# Patient Record
Sex: Female | Born: 1985 | Race: Black or African American | Hispanic: No | Smoking: Never smoker
Health system: Southern US, Community
[De-identification: ages and names within clinical notes are randomized; demographics above are authoritative.]

## PROBLEM LIST (undated history)

## (undated) DIAGNOSIS — J45909 Unspecified asthma, uncomplicated: Secondary | ICD-10-CM

## (undated) DIAGNOSIS — M419 Scoliosis, unspecified: Secondary | ICD-10-CM

## (undated) DIAGNOSIS — B019 Varicella without complication: Secondary | ICD-10-CM

## (undated) DIAGNOSIS — J302 Other seasonal allergic rhinitis: Secondary | ICD-10-CM

## (undated) DIAGNOSIS — Z9109 Other allergy status, other than to drugs and biological substances: Secondary | ICD-10-CM

## (undated) HISTORY — PX: TUBAL LIGATION: SHX77

## (undated) HISTORY — DX: Varicella without complication: B01.9

## (undated) HISTORY — PX: WISDOM TOOTH EXTRACTION: SHX21

## (undated) HISTORY — DX: Other allergy status, other than to drugs and biological substances: Z91.09

## (undated) HISTORY — DX: Other seasonal allergic rhinitis: J30.2

---

## 2013-02-15 ENCOUNTER — Emergency Department (HOSPITAL_COMMUNITY)
Admission: EM | Admit: 2013-02-15 | Discharge: 2013-02-16 | Disposition: A | Discharge status: Home / Self Care | Payer: Managed Care, Other (non HMO) | Attending: Emergency Medicine | Admitting: Emergency Medicine

## 2013-02-15 ENCOUNTER — Encounter (HOSPITAL_COMMUNITY): Payer: Self-pay | Admitting: Emergency Medicine

## 2013-02-15 DIAGNOSIS — B349 Viral infection, unspecified: Secondary | ICD-10-CM

## 2013-02-15 DIAGNOSIS — J45909 Unspecified asthma, uncomplicated: Secondary | ICD-10-CM | POA: Insufficient documentation

## 2013-02-15 DIAGNOSIS — B9789 Other viral agents as the cause of diseases classified elsewhere: Secondary | ICD-10-CM | POA: Insufficient documentation

## 2013-02-15 DIAGNOSIS — J3489 Other specified disorders of nose and nasal sinuses: Secondary | ICD-10-CM | POA: Insufficient documentation

## 2013-02-15 DIAGNOSIS — Z8739 Personal history of other diseases of the musculoskeletal system and connective tissue: Secondary | ICD-10-CM | POA: Insufficient documentation

## 2013-02-15 DIAGNOSIS — Z791 Long term (current) use of non-steroidal anti-inflammatories (NSAID): Secondary | ICD-10-CM | POA: Insufficient documentation

## 2013-02-15 DIAGNOSIS — Z3202 Encounter for pregnancy test, result negative: Secondary | ICD-10-CM | POA: Insufficient documentation

## 2013-02-15 HISTORY — DX: Scoliosis, unspecified: M41.9

## 2013-02-15 HISTORY — DX: Unspecified asthma, uncomplicated: J45.909

## 2013-02-15 LAB — POCT PREGNANCY, URINE: Preg Test, Ur: NEGATIVE

## 2013-02-15 NOTE — ED Notes (Signed)
Pt c/o generalized body aches, chills, and malaise x2 weeks. Reports cough and DOE since this evening while at work. Pt states cough is dry/non-productive and reports aches/chills have gotten worse over the last two days. Pt A&Ox4, in NAD at this time.

## 2013-02-15 NOTE — ED Notes (Signed)
Pt. reports generalized body aches with chills , dry cough  And runny nose for 2 weeks unrelieved by OTC medications .

## 2013-02-16 MED ORDER — ALBUTEROL SULFATE HFA 108 (90 BASE) MCG/ACT IN AERS: 2.0000 | INHALATION_SPRAY | RESPIRATORY_TRACT | Status: AC | PRN

## 2013-02-16 MED ORDER — NAPROXEN 375 MG PO TABS: 375.0000 mg | ORAL_TABLET | Freq: Two times a day (BID) | ORAL | Status: DC

## 2013-02-16 NOTE — ED Provider Notes (Signed)
CSN: [id number]     Arrival date & time 02/15/13  1851 History   First MD Initiated Contact with Patient 02/15/13 2305     Chief Complaint  Patient presents with  . Generalized Body Aches   (Consider location/radiation/quality/duration/timing/severity/associated sxs/prior Treatment) Patient is a 27 y.o. female presenting with URI. The history is provided by the patient.  URI Presenting symptoms: congestion, cough and rhinorrhea   Presenting symptoms: no fever   Congestion:    Location:  Nasal   Interferes with sleep: no     Interferes with eating/drinking: no   Cough:    Cough characteristics:  Non-productive   Severity:  Mild   Onset quality:  Gradual   Timing:  Sporadic   Progression:  Unchanged   Chronicity:  New Severity:  Moderate Onset quality:  Gradual Timing:  Constant Progression:  Unchanged Chronicity:  New Relieved by:  Nothing Worsened by:  Nothing tried Ineffective treatments:  None tried Associated symptoms: no neck pain, no swollen glands and no wheezing   Risk factors: sick contacts   Risk factors: no recent illness     Past Medical History  Diagnosis Date  . Asthma   . Scoliosis    Past Surgical History  Procedure Laterality Date  . Tubal ligation     No family history on file. History  Substance Use Topics  . Smoking status: Never Smoker   . Smokeless tobacco: Not on file  . Alcohol Use: No   OB History   Grav Para Term Preterm Abortions TAB SAB Ect Mult Living                 Review of Systems  Constitutional: Negative for fever.  HENT: Positive for congestion and rhinorrhea. Negative for drooling.   Respiratory: Positive for cough. Negative for wheezing.   Cardiovascular: Negative for chest pain and leg swelling.  Musculoskeletal: Negative for neck pain.  All other systems reviewed and are negative.    Allergies  Shellfish allergy  Home Medications   Current Outpatient Rx  Name  Route  Sig  Dispense  Refill  . naproxen  (NAPROSYN) 375 MG tablet   Oral   Take 1 tablet (375 mg total) by mouth 2 (two) times daily.   20 tablet   0    BP 115/65  Pulse 99  Temp(Src) 99.7 F (37.6 C) (Oral)  Resp 20  SpO2 99% Physical Exam  Constitutional: She is oriented to person, place, and time. She appears well-developed and well-nourished. No distress.  HENT:  Head: Normocephalic and atraumatic.  Mouth/Throat: Oropharynx is clear and moist. No oropharyngeal exudate.  Eyes: Conjunctivae are normal. Pupils are equal, round, and reactive to light.  Neck: Normal range of motion. Neck supple.  Cardiovascular: Normal rate, regular rhythm and intact distal pulses.   Pulmonary/Chest: Effort normal and breath sounds normal. She has no wheezes. She has no rales.  Abdominal: Soft. Bowel sounds are normal. There is no tenderness. There is no rebound and no guarding.  Musculoskeletal: Normal range of motion.  Lymphadenopathy:    She has no cervical adenopathy.  Neurological: She is alert and oriented to person, place, and time.  Skin: Skin is warm and dry.  Psychiatric: She has a normal mood and affect.    ED Course  Procedures (including critical care time) Labs Review Labs Reviewed  RAPID STREP SCREEN  CULTURE, GROUP A STREP  POCT PREGNANCY, URINE   Imaging Review No results found.  EKG Interpretation   None  MDM   1. Viral syndrome    Will treat pain with NSAIDS and recommended mucinex.  Will refill inhaler    Billey Wojciak K Amahd Morino-Rasch, MD 02/16/13 0145

## 2013-02-17 LAB — CULTURE, GROUP A STREP

## 2013-07-10 ENCOUNTER — Telehealth: Payer: Self-pay | Admitting: Physician Assistant

## 2013-07-10 ENCOUNTER — Ambulatory Visit (INDEPENDENT_AMBULATORY_CARE_PROVIDER_SITE_OTHER): Payer: Managed Care, Other (non HMO) | Admitting: Physician Assistant

## 2013-07-10 ENCOUNTER — Encounter: Payer: Self-pay | Admitting: Physician Assistant

## 2013-07-10 ENCOUNTER — Ambulatory Visit (HOSPITAL_BASED_OUTPATIENT_CLINIC_OR_DEPARTMENT_OTHER)
Admission: RE | Admit: 2013-07-10 | Discharge: 2013-07-10 | Disposition: A | Discharge status: Home / Self Care | Payer: Managed Care, Other (non HMO) | Source: Ambulatory Visit | Attending: Physician Assistant | Admitting: Physician Assistant

## 2013-07-10 VITALS — BP 112/76 | HR 94 | Temp 98.7°F | Resp 16 | Ht 62.0 in | Wt 189.2 lb

## 2013-07-10 DIAGNOSIS — R053 Chronic cough: Secondary | ICD-10-CM

## 2013-07-10 DIAGNOSIS — R05 Cough: Secondary | ICD-10-CM

## 2013-07-10 DIAGNOSIS — J189 Pneumonia, unspecified organism: Secondary | ICD-10-CM | POA: Insufficient documentation

## 2013-07-10 DIAGNOSIS — R918 Other nonspecific abnormal finding of lung field: Secondary | ICD-10-CM | POA: Insufficient documentation

## 2013-07-10 DIAGNOSIS — R059 Cough, unspecified: Secondary | ICD-10-CM | POA: Insufficient documentation

## 2013-07-10 DIAGNOSIS — I517 Cardiomegaly: Secondary | ICD-10-CM | POA: Insufficient documentation

## 2013-07-10 LAB — CBC WITH DIFFERENTIAL/PLATELET
BASOS PCT: 0.4 % (ref 0.0–3.0)
Basophils Absolute: 0 10*3/uL (ref 0.0–0.1)
Eosinophils Absolute: 0.3 10*3/uL (ref 0.0–0.7)
Eosinophils Relative: 3.5 % (ref 0.0–5.0)
HCT: 37.1 % (ref 36.0–46.0)
HEMOGLOBIN: 12.1 g/dL (ref 12.0–15.0)
Lymphocytes Relative: 25.5 % (ref 12.0–46.0)
Lymphs Abs: 2 10*3/uL (ref 0.7–4.0)
MCHC: 32.7 g/dL (ref 30.0–36.0)
MCV: 88.4 fl (ref 78.0–100.0)
Monocytes Absolute: 0.7 10*3/uL (ref 0.1–1.0)
Monocytes Relative: 8.3 % (ref 3.0–12.0)
NEUTROS ABS: 4.9 10*3/uL (ref 1.4–7.7)
Neutrophils Relative %: 62.3 % (ref 43.0–77.0)
Platelets: 248 10*3/uL (ref 150.0–400.0)
RBC: 4.2 Mil/uL (ref 3.87–5.11)
RDW: 14.1 % (ref 11.5–14.6)
WBC: 7.9 10*3/uL (ref 4.5–10.5)

## 2013-07-10 MED ORDER — OMEPRAZOLE 20 MG PO CPDR: 20.0000 mg | DELAYED_RELEASE_CAPSULE | Freq: Every day | ORAL | Status: DC

## 2013-07-10 MED ORDER — LEVOFLOXACIN 750 MG PO TABS: 750.0000 mg | ORAL_TABLET | Freq: Every day | ORAL | Status: DC

## 2013-07-10 MED ORDER — HYDROCOD POLST-CHLORPHEN POLST 10-8 MG/5ML PO LQCR: 5.0000 mL | Freq: Two times a day (BID) | ORAL | Status: DC | PRN

## 2013-07-10 NOTE — Telephone Encounter (Signed)
Spoke with patient. CXR reveals Community-Acquired Pneumonia.  Rx Levaquin.  Continue care as discussed at today's visit.  Follow-up in 1 week.

## 2013-07-10 NOTE — Patient Instructions (Signed)
Please obtain labs.  I will call you with your results. Please go to [facility] [place] at [id number] [place], [place] for x-ray.  The imaging department is located on the first floor just right of the elevator.  I will call you with these results as well.  Start prilosec daily.  Use Tussionex as directed for cough.  We will alter therapy based on results.  Follow-up with Allergist as scheduled.  Follow-up here in 2 weeks.  Return sooner if symptoms worsen.

## 2013-07-10 NOTE — Assessment & Plan Note (Addendum)
Chronic cough x 5 months.  Multiple allergy medications tried.  Patient is a non-smoker.  Question GERD vs Pulmonary etiology.  Will obtain labs and CXR to r/o pathology.  Will start on 2-week trial of Prilosec.  Increase fluid intake.  Use Tussionex for cough.    Update -- CXR reveals pneumonia.  Rx Levaquin.  Supportive measures reiterated.  Follow-up in 1 week.

## 2013-07-10 NOTE — Progress Notes (Signed)
Pre visit review using our clinic review tool, if applicable. No additional management support is needed unless otherwise documented below in the visit note/SLS  

## 2013-07-10 NOTE — Progress Notes (Signed)
Patient presents to clinic today c/o chronic dry cough x 5 months.  Patient states she has [facility] onset of symptoms.  Endorses continued cough.  Stated cough is persistent and sometimes causes her to dry heave.  Endorses a couple of emetic episodes.  Denies heartburn or globus.  Denies hx of asthma.  Has had some allergy symptoms including rhinorrhea and watery eyes.  Has tried zyrtec, Claritin, allegra, off-brand preparations, Nasonex and Flonase with no relief of symptoms.  Past Medical History  Diagnosis Date  . Asthma   . Scoliosis   . Chicken pox   . Seasonal allergies   . Environmental allergies     Current Outpatient Prescriptions on File Prior to Visit  Medication Sig Dispense Refill  . albuterol (PROVENTIL HFA;VENTOLIN HFA) 108 (90 BASE) MCG/ACT inhaler Inhale 2 puffs into the lungs every 4 (four) hours as needed for wheezing or shortness of breath.  1 Inhaler  0  . naproxen (NAPROSYN) 375 MG tablet Take 1 tablet (375 mg total) by mouth 2 (two) times daily.  20 tablet  0   No current facility-administered medications on file prior to visit.    Allergies  Allergen Reactions  . Shellfish Allergy Anaphylaxis    Family History  Problem Relation Age of Onset  . Hypertension Father     Living  . Hypertension Paternal Grandfather   . Diabetes Maternal Grandmother   . Diabetes Paternal Grandfather   . Healthy Mother     Living  . Alzheimer's disease Paternal Grandfather   . Hypertension Other     Paternal Aunts & Uncles  . Cancer Neg Hx   . Healthy Brother     x1  . Healthy Sister     x1  . Allergies Daughter   . Healthy Son     History   Social History  . Marital Status: Single    Spouse Name: N/A    Number of Children: N/A  . Years of Education: N/A   Social History Main Topics  . Smoking status: Never Smoker   . Smokeless tobacco: None  . Alcohol Use: No  . Drug Use: No  . Sexual Activity:  None   Other Topics Concern  . None   Social History Narrative  . None   Review of Systems - See HPI.  All other ROS are negative.  BP 112/76  Pulse 94  Temp(Src) 98.7 F (37.1 C) (Oral)  Resp 16  Ht 5\' 2"  (1.575 m)  Wt 189 lb 4 oz (85.843 kg)  BMI 34.61 kg/m2  SpO2 100%  LMP 07/09/2013  Physical Exam  Vitals reviewed. Constitutional: She is oriented to person, place, and time and well-developed, well-nourished, and in no distress.  HENT:  Head: Normocephalic and atraumatic.  Right Ear: External ear normal.  Left Ear: External ear normal.  Nose: Nose normal.  Mouth/Throat: Oropharynx is clear and moist. No oropharyngeal exudate.  TM within normal limits bilaterally.  No TTP of sinuses noted on exam.  Eyes: Conjunctivae are normal. Pupils are equal, round, and reactive to light.  Neck: Neck supple.  Cardiovascular: Normal rate, regular rhythm, normal heart sounds and intact distal pulses.   Pulmonary/Chest: Effort normal and breath sounds normal. No respiratory distress. She has no wheezes. She has no rales. She exhibits no tenderness.  Lymphadenopathy:    She has no cervical adenopathy.  Neurological: She is alert and oriented to person, place, and time.  Skin: Skin is warm and dry. No rash noted.  Psychiatric: Affect normal.    Recent Results (from the past 2160 hour(s))  CBC WITH DIFFERENTIAL     Status: None   Collection Time    07/10/13 10:47 AM      Result Value Ref Range   WBC 7.9  4.5 - 10.5 K/uL   RBC 4.20  3.87 - 5.11 Mil/uL   Hemoglobin 12.1  12.0 - 15.0 g/dL   HCT 16.137.1  09.636.0 - 04.546.0 %   MCV 88.4  78.0 - 100.0 fl   MCHC 32.7  30.0 - 36.0 g/dL   RDW 40.914.1  81.111.5 - 91.414.6 %   Platelets 248.0  150.0 - 400.0 K/uL   Neutrophils Relative % 62.3  43.0 - 77.0 %   Lymphocytes Relative 25.5  12.0 - 46.0 %   Monocytes Relative 8.3  3.0 - 12.0 %   Eosinophils Relative 3.5  0.0 - 5.0 %   Basophils Relative 0.4  0.0 - 3.0 %   Neutro Abs 4.9  1.4 - 7.7 K/uL   Lymphs  Abs 2.0  0.7 - 4.0 K/uL   Monocytes Absolute 0.7  0.1 - 1.0 K/uL   Eosinophils Absolute 0.3  0.0 - 0.7 K/uL   Basophils Absolute 0.0  0.0 - 0.1 K/uL   Assessment/Plan: CAP (community acquired pneumonia) Chronic cough x 5 months.  Multiple allergy medications tried.  Patient is a non-smoker.  Question GERD vs Pulmonary etiology.  Will obtain labs and CXR to r/o pathology.  Will start on 2-week trial of Prilosec.  Increase fluid intake.  Use Tussionex for cough.    Update -- CXR reveals pneumonia.  Rx Levaquin.  Supportive measures reiterated.  Follow-up in 1 week.

## 2013-07-11 NOTE — Addendum Note (Signed)
Addended by: Marcelline MatesMARTIN, Caterina Racine on: 07/11/2013 09:56 AM   Modules accepted: Level of Service

## 2013-07-12 ENCOUNTER — Ambulatory Visit: Payer: Self-pay | Admitting: Physician Assistant

## 2013-07-12 ENCOUNTER — Telehealth: Payer: Self-pay | Admitting: *Deleted

## 2013-07-12 DIAGNOSIS — J189 Pneumonia, unspecified organism: Secondary | ICD-10-CM

## 2013-07-12 MED ORDER — BENZONATATE 100 MG PO CAPS: 100.0000 mg | ORAL_CAPSULE | Freq: Two times a day (BID) | ORAL | Status: DC | PRN

## 2013-07-12 NOTE — Telephone Encounter (Signed)
Sent in tessalon perles.  Take as directed.  I hope this helps with cough.

## 2013-07-12 NOTE — Telephone Encounter (Signed)
Patient reports that cough medication prescribed on 05.04.15 is only making her sleep but does not help with cough; request alternative Rx to [facility] pharmacy/SLS

## 2013-07-13 NOTE — Telephone Encounter (Signed)
Patient informed, understood & agreed/SLS  

## 2013-07-17 ENCOUNTER — Encounter: Payer: Self-pay | Admitting: Physician Assistant

## 2013-07-17 ENCOUNTER — Telehealth: Payer: Self-pay | Admitting: Physician Assistant

## 2013-07-17 ENCOUNTER — Encounter: Payer: Self-pay | Admitting: *Deleted

## 2013-07-17 ENCOUNTER — Ambulatory Visit (HOSPITAL_BASED_OUTPATIENT_CLINIC_OR_DEPARTMENT_OTHER)
Admission: RE | Admit: 2013-07-17 | Discharge: 2013-07-17 | Disposition: A | Discharge status: Home / Self Care | Payer: Managed Care, Other (non HMO) | Source: Ambulatory Visit | Attending: Physician Assistant | Admitting: Physician Assistant

## 2013-07-17 ENCOUNTER — Ambulatory Visit (INDEPENDENT_AMBULATORY_CARE_PROVIDER_SITE_OTHER): Payer: Managed Care, Other (non HMO) | Admitting: Physician Assistant

## 2013-07-17 VITALS — BP 104/72 | HR 83 | Temp 99.0°F | Resp 16 | Ht 62.0 in | Wt 188.2 lb

## 2013-07-17 DIAGNOSIS — R05 Cough: Secondary | ICD-10-CM

## 2013-07-17 DIAGNOSIS — R053 Chronic cough: Secondary | ICD-10-CM | POA: Insufficient documentation

## 2013-07-17 DIAGNOSIS — J309 Allergic rhinitis, unspecified: Secondary | ICD-10-CM

## 2013-07-17 DIAGNOSIS — J189 Pneumonia, unspecified organism: Secondary | ICD-10-CM

## 2013-07-17 DIAGNOSIS — R059 Cough, unspecified: Secondary | ICD-10-CM

## 2013-07-17 DIAGNOSIS — Z0000 Encounter for general adult medical examination without abnormal findings: Secondary | ICD-10-CM | POA: Insufficient documentation

## 2013-07-17 DIAGNOSIS — M4120 Other idiopathic scoliosis, site unspecified: Secondary | ICD-10-CM | POA: Insufficient documentation

## 2013-07-17 LAB — LIPID PANEL
Cholesterol: 154 mg/dL (ref 0–200)
HDL: 44 mg/dL (ref >39.00)
LDL CALC: 97 mg/dL (ref 0–99)
TRIGLYCERIDES: 64 mg/dL (ref 0.0–149.0)
Total CHOL/HDL Ratio: 4
VLDL: 12.8 mg/dL (ref 0.0–40.0)

## 2013-07-17 LAB — CBC WITH DIFFERENTIAL/PLATELET
Basophils Absolute: 0 10*3/uL (ref 0.0–0.1)
Basophils Relative: 0.3 % (ref 0.0–3.0)
EOS PCT: 4 % (ref 0.0–5.0)
Eosinophils Absolute: 0.3 10*3/uL (ref 0.0–0.7)
HCT: 43.2 % (ref 36.0–46.0)
HEMOGLOBIN: 14 g/dL (ref 12.0–15.0)
Lymphocytes Relative: 23.7 % (ref 12.0–46.0)
Lymphs Abs: 1.7 10*3/uL (ref 0.7–4.0)
MCHC: 32.4 g/dL (ref 30.0–36.0)
MCV: 87.8 fl (ref 78.0–100.0)
MONOS PCT: 8.6 % (ref 3.0–12.0)
Monocytes Absolute: 0.6 10*3/uL (ref 0.1–1.0)
NEUTROS ABS: 4.5 10*3/uL (ref 1.4–7.7)
Neutrophils Relative %: 63.4 % (ref 43.0–77.0)
Platelets: 228 10*3/uL (ref 150.0–400.0)
RBC: 4.91 Mil/uL (ref 3.87–5.11)
RDW: 14 % (ref 11.5–15.5)
WBC: 7.1 10*3/uL (ref 4.0–10.5)

## 2013-07-17 LAB — HEPATIC FUNCTION PANEL
ALBUMIN: 3.9 g/dL (ref 3.5–5.2)
ALT: 11 U/L (ref 0–35)
AST: 23 U/L (ref 0–37)
Alkaline Phosphatase: 84 U/L (ref 39–117)
Bilirubin, Direct: 0 mg/dL (ref 0.0–0.3)
Total Bilirubin: 0.6 mg/dL (ref 0.2–1.2)
Total Protein: 8.1 g/dL (ref 6.0–8.3)

## 2013-07-17 LAB — TSH: TSH: 0.55 u[IU]/mL (ref 0.35–4.50)

## 2013-07-17 LAB — HEMOGLOBIN A1C: Hgb A1c MFr Bld: 5.2 % (ref 4.6–6.5)

## 2013-07-17 MED ORDER — MONTELUKAST SODIUM 10 MG PO TABS
10.0000 mg | ORAL_TABLET | Freq: Every day | ORAL | Status: AC
Start: 2013-07-17 — End: ?

## 2013-07-17 MED ORDER — AZITHROMYCIN 250 MG PO TABS: ORAL_TABLET | ORAL | Status: DC

## 2013-07-17 MED ORDER — ONDANSETRON 8 MG PO TBDP
8.0000 mg | ORAL_TABLET | Freq: Three times a day (TID) | ORAL | Status: AC | PRN
Start: 2013-07-17 — End: ?

## 2013-07-17 MED ORDER — PROMETHAZINE-DM 6.25-15 MG/5ML PO SYRP: 5.0000 mL | ORAL_SOLUTION | Freq: Four times a day (QID) | ORAL | Status: DC | PRN

## 2013-07-17 NOTE — Progress Notes (Signed)
Pre visit review using our clinic review tool, if applicable. No additional management support is needed unless otherwise documented below in the visit note/SLS  

## 2013-07-17 NOTE — Patient Instructions (Addendum)
Please take antibiotic as directed.  Use other medications as prescribed when needed.  Take singulair daily.  You can stop the omeprazole. Please have your cousin drive [facility]. You will be contacted by Pulmonology for an appointment.  Please keep your appointment with the Allergy specialist.  I will call you with your lab results.  If anything comes back abnormal, we will treat you accordingly.  Please call or return to clinic if symptoms are not improving and/or in 1 week if you have not seen Pulmonology yet.  IF symptoms acutely worsen, please call 911 or proceed to the ER.

## 2013-07-17 NOTE — Progress Notes (Signed)
Patient presents to clinic today to formally establish care.  Patient was seen last week for an acute concern and was diagnosed with Community-Acquired Pneumonia.    Acute Concerns: CAP -- diagnosed 1 week ago via X-ray.  Patient has completed course of Levaquin.  Patient endorses improvement in her energy level.  Still having cough, which had become dry, but states cough has now become productive again..  Denies pleuritic chest pain, SOB or wheezing.  Endorses continued rhinorrhea, PND and cough. Vital signs are within normal limits with Os sats at 100 % RA.  Chronic Issues: No known significant PMH.  Health Maintenance: Dental -- Overdue. Has upcoming appointment this month [facility] -- Overdue Immunizations -- Overdue for Tetanus.  Declines at today's visit. PAP -- Last PAP 07/2013.  Followed by OB/GYN.  Past Medical History  Diagnosis Date  . Asthma   . Scoliosis   . Chicken pox   . Seasonal allergies   . Environmental allergies     Past Surgical History  Procedure Laterality Date  . Tubal ligation    . Wisdom tooth extraction      Current Outpatient Prescriptions on File Prior to Visit  Medication Sig Dispense Refill  . albuterol (PROVENTIL HFA;VENTOLIN HFA) 108 (90 BASE) MCG/ACT inhaler Inhale 2 puffs into the lungs every 4 (four) hours as needed for wheezing or shortness of breath.  1 Inhaler  0  . omeprazole (PRILOSEC) 20 MG capsule Take 1 capsule (20 mg total) by mouth daily.  30 capsule  3  . naproxen (NAPROSYN) 375 MG tablet Take 1 tablet (375 mg total) by mouth 2 (two) times daily.  20 tablet  0   No current facility-administered medications on file prior to visit.    Allergies  Allergen Reactions  . Shellfish Allergy Anaphylaxis    Family History  Problem Relation Age of Onset  . Hypertension Father     Living  . Hypertension Paternal Grandfather   . Diabetes Maternal Grandmother   . Diabetes Paternal Grandfather   . Healthy Mother     Living  .  Alzheimer's disease Paternal Grandfather   . Hypertension Other     Paternal Aunts & Uncles  . Cancer Neg Hx   . Healthy Brother     x1  . Healthy Sister     x1  . Allergies Daughter   . Healthy Son     History   Social History  . Marital Status: Single    Spouse Name: N/A    Number of Children: N/A  . Years of Education: N/A   Occupational History  . Not on file.   Social History Main Topics  . Smoking status: Never Smoker   . Smokeless tobacco: Never Used  . Alcohol Use: Yes     Comment: occasionally  . Drug Use: No  . Sexual Activity: Not Currently    Birth Control/ Protection: Surgical   Other Topics Concern  . Not on file   Social History Narrative  . No narrative on file    Review of Systems  Constitutional: Negative for fever and weight loss.  HENT: Negative for ear discharge, ear pain, hearing loss and tinnitus.   Eyes: Negative for blurred vision, double vision and photophobia.  Respiratory: Positive for cough and sputum production. Negative for hemoptysis, shortness of breath and wheezing.   Gastrointestinal: Positive for nausea. Negative for heartburn, vomiting, abdominal pain, diarrhea, constipation, blood in stool and melena.  Genitourinary: Negative for dysuria, urgency, frequency, hematuria and  flank pain.  Neurological: Positive for headaches. Negative for dizziness and loss of consciousness.  Endo/Heme/Allergies: Positive for environmental allergies.  Psychiatric/Behavioral: Negative for depression, suicidal ideas, hallucinations and substance abuse. The patient is not nervous/anxious and does not have insomnia.    BP 104/72  Pulse 83  Temp(Src) 99 F (37.2 C) (Oral)  Resp 16  Ht 5\' 2"  (1.575 m)  Wt 188 lb 4 oz (85.39 kg)  BMI 34.42 kg/m2  SpO2 100%  LMP 07/09/2013  Physical Exam  Vitals reviewed. Constitutional: She is oriented to person, place, and time and well-developed, well-nourished, and in no distress.  HENT:  Head:  Normocephalic and atraumatic.  Right Ear: External ear normal.  Left Ear: External ear normal.  Nose: Nose normal.  Mouth/Throat: Oropharynx is clear and moist. No oropharyngeal exudate.  TM within normal limits bilaterally.  Eyes: Conjunctivae and EOM are normal. Pupils are equal, round, and reactive to light.  Neck: Normal range of motion. Neck supple.  Cardiovascular: Normal rate, regular rhythm, normal heart sounds and intact distal pulses.   Pulmonary/Chest: Effort normal and breath sounds normal. No respiratory distress. She has no wheezes. She has no rales. She exhibits no tenderness.  Abdominal: Soft. Bowel sounds are normal. She exhibits no distension and no mass. There is no tenderness. There is no rebound and no guarding.  Lymphadenopathy:    She has no cervical adenopathy.  Neurological: She is alert and oriented to person, place, and time.  Skin: Skin is warm and dry. No rash noted.  Psychiatric: Affect normal.    Recent Results (from the past 2160 hour(s))  CBC WITH DIFFERENTIAL     Status: None   Collection Time    07/10/13 10:47 AM      Result Value Ref Range   WBC 7.9  4.5 - 10.5 K/uL   RBC 4.20  3.87 - 5.11 Mil/uL   Hemoglobin 12.1  12.0 - 15.0 g/dL   HCT 60.437.1  54.036.0 - 98.146.0 %   MCV 88.4  78.0 - 100.0 fl   MCHC 32.7  30.0 - 36.0 g/dL   RDW 19.114.1  47.811.5 - 29.514.6 %   Platelets 248.0  150.0 - 400.0 K/uL   Neutrophils Relative % 62.3  43.0 - 77.0 %   Lymphocytes Relative 25.5  12.0 - 46.0 %   Monocytes Relative 8.3  3.0 - 12.0 %   Eosinophils Relative 3.5  0.0 - 5.0 %   Basophils Relative 0.4  0.0 - 3.0 %   Neutro Abs 4.9  1.4 - 7.7 K/uL   Lymphs Abs 2.0  0.7 - 4.0 K/uL   Monocytes Absolute 0.7  0.1 - 1.0 K/uL   Eosinophils Absolute 0.3  0.0 - 0.7 K/uL   Basophils Absolute 0.0  0.0 - 0.1 K/uL   Assessment/Plan: CAP (community acquired pneumonia) Vitals are good.  Exam unremarkable.  Patient still with chronic cough of unknown etiology.  Will Rx Azithromycin.  Rx  Singulair for allergies.  Continue Flonase.  Continue other supportive measures.  Will obtain labs at today's visit. Will obtain repeat CXR to ensure no worsening of pneumonia on film. Follow-up with allergist.  Referral placed to Pulmonology for further evaluation  Of note, patient became lightheaded and nauseas when lab technician was obtaining labs.  Patient was brought back to run to lie down.  All blood work was not able to be obtained.  Patient continued to feel lightheaded.  Repeat examination and vital signs were unremarkable.  Discussed increasing fluid  intake.  Patient stable before being allowed to leave clinic.  Patient's cousin drove patient home.  Patient and family member instructed to take patient to the ER if symptoms did not improve.  Visit for preventive health examination Medical history reviewed and updated. Will obtain fasting labs.   Patient followed by OB/GYN.  UTD on Pap.

## 2013-07-17 NOTE — Assessment & Plan Note (Addendum)
Vitals are good.  Exam unremarkable.  Patient still with chronic cough of unknown etiology.  Will Rx Azithromycin.  Rx Singulair for allergies.  Continue Flonase.  Continue other supportive measures.  Will obtain labs at today's visit. Will obtain repeat CXR to ensure no worsening of pneumonia on film. Follow-up with allergist.  Referral placed to Pulmonology for further evaluation  Of note, patient became lightheaded and nauseas when lab technician was obtaining labs.  Patient was brought back to run to lie down.  All blood work was not able to be obtained.  Patient continued to feel lightheaded.  Repeat examination and vital signs were unremarkable.  Discussed increasing fluid intake.  Patient stable before being allowed to leave clinic.  Patient's cousin drove patient home.  Patient and family member instructed to take patient to the ER if symptoms did not improve.

## 2013-07-17 NOTE — Assessment & Plan Note (Signed)
Medical history reviewed and updated. Will obtain fasting labs.   Patient followed by OB/GYN.  UTD on Pap.

## 2013-07-17 NOTE — Telephone Encounter (Signed)
Discussed results personally with patient.  Patient has appointment scheduled with Dr. Sherene SiresWert on Friday for further evaluation.  Patient to continue care as discussed at her visit today.

## 2013-07-18 LAB — HEPATIC FUNCTION PANEL
ALT: 8 U/L (ref 0–35)
AST: 14 U/L (ref 0–37)
Albumin: 3.9 g/dL (ref 3.5–5.2)
Alkaline Phosphatase: 83 U/L (ref 39–117)
BILIRUBIN INDIRECT: 0.2 mg/dL (ref 0.2–1.2)
Bilirubin, Direct: 0.1 mg/dL (ref 0.0–0.3)
TOTAL PROTEIN: 7.1 g/dL (ref 6.0–8.3)
Total Bilirubin: 0.3 mg/dL (ref 0.2–1.2)

## 2013-07-18 LAB — LIPID PANEL
Cholesterol: 142 mg/dL (ref 0–200)
HDL: 45 mg/dL (ref >39)
LDL CALC: 85 mg/dL (ref 0–99)
TRIGLYCERIDES: 61 mg/dL (ref <150)
Total CHOL/HDL Ratio: 3.2 Ratio
VLDL: 12 mg/dL (ref 0–40)

## 2013-07-18 LAB — TSH: TSH: 0.852 u[IU]/mL (ref 0.350–4.500)

## 2013-07-21 ENCOUNTER — Ambulatory Visit (INDEPENDENT_AMBULATORY_CARE_PROVIDER_SITE_OTHER): Payer: Managed Care, Other (non HMO) | Admitting: Internal Medicine

## 2013-07-21 ENCOUNTER — Encounter: Payer: Self-pay | Admitting: Internal Medicine

## 2013-07-21 ENCOUNTER — Encounter: Payer: Self-pay | Admitting: *Deleted

## 2013-07-21 VITALS — BP 102/64 | HR 88 | Temp 98.1°F | Ht 61.0 in | Wt 191.2 lb

## 2013-07-21 DIAGNOSIS — R059 Cough, unspecified: Secondary | ICD-10-CM

## 2013-07-21 DIAGNOSIS — R05 Cough: Secondary | ICD-10-CM

## 2013-07-21 DIAGNOSIS — R053 Chronic cough: Secondary | ICD-10-CM

## 2013-07-21 MED ORDER — PANTOPRAZOLE SODIUM 40 MG PO TBEC: 40.0000 mg | DELAYED_RELEASE_TABLET | Freq: Every day | ORAL | Status: DC

## 2013-07-21 MED ORDER — TRAMADOL HCL 50 MG PO TABS: ORAL_TABLET | ORAL | Status: DC

## 2013-07-21 MED ORDER — FAMOTIDINE 20 MG PO TABS: ORAL_TABLET | ORAL | Status: DC

## 2013-07-21 MED ORDER — PREDNISONE 10 MG PO TABS: ORAL_TABLET | ORAL | Status: DC

## 2013-07-21 NOTE — Patient Instructions (Signed)
The key to effective treatment for your cough is eliminating the non-stop cycle of cough you're stuck in long enough to let your airway heal completely and then see if there is anything still making you cough once you stop the cough suppression, but this should take no more than 5 days to figure out  First take delsym two tsp every 12 hours and supplement if needed with  tramadol 50 mg up to 2 every 4 hours to suppress the urge to cough at all or even clear your throat. Swallowing water or using ice chips/non mint and menthol containing candies (such as lifesavers or sugarless jolly ranchers) are also effective.  You should rest your voice and avoid activities that you know make you cough.  Once you have eliminated the cough for 3 straight days try reducing the tramadol first,  then the delsym as tolerated.    Prednisone 10 mg take  4 each am x 2 days,   2 each am x 2 days,  1 each am x 2 days and stop (this is to eliminate allergies and inflammation from coughing)  Protonix (pantoprazole) Take 30-60 min before first meal of the day and Pepcid 20 mg one bedtime plus chlorpheniramine 4 mg x 2 at bedtime (both available over the counter)  until cough is completely gone for at least a week without the need for cough suppression  GERD (REFLUX)  is an extremely common cause of respiratory symptoms, many times with no significant heartburn at all.    It can be treated with medication, but also with lifestyle changes including avoidance of late meals, excessive alcohol, smoking cessation, and avoid fatty foods, chocolate, peppermint, colas, red wine, and acidic juices such as orange juice.  NO MINT OR MENTHOL PRODUCTS SO NO COUGH DROPS  USE HARD CANDY INSTEAD (jolley ranchers or Stover's or Lifesavers (all available in sugarless versions) NO OIL BASED VITAMINS - use powdered substitutes.  Return in 2 weeks if no better

## 2013-07-21 NOTE — Progress Notes (Signed)
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 Pulmonary Consult    Referred by Dr Marcelline MatesWilliam Martin. Pt c/o cough x 6 months- occ prod with minimal clear sputum.  Sometimes coughs to the point of vomiting- at least twice per day.   acute onset cough with fever and aching all over  in December 2014 seen in ER  rx with inhaler no better and everything got better x for cough daily since with vomiting twice daily  No peak or trough, same at work as home  No better with inhalers Just started singulair a few days prior to OV  - no change     Kouffman Reflux v Neurogenic Cough Differentiator Reflux Comments  Do you awaken from a sound sleep coughing violently?                            With trouble breathing? Yes   Do you have choking episodes when you cannot  Get enough air, gasping for air ?              no   Do you usually cough when you lie down into  The bed, or when you just lie down to rest ?                          Yes   Do you usually cough after meals or eating?         no   Do you cough when (or after) you bend over?    no   GERD SCORE     Kouffman Reflux v Neurogenic Cough Differentiator Neurogenic   Do you more-or-less cough all day long? yes   Does change of temperature make you cough? yes   Does laughing or chuckling cause you to cough? yes   Do fumes (perfume, automobile fumes, burned  Toast, etc.,) cause you to cough ?      no   Does speaking, singing, or talking on the phone cause you to cough   ?               sometimes   Neurogenic/Airway score        Review of  Systems  Constitutional: Negative for fever, chills and unexpected weight change.  HENT: Positive for sneezing. Negative for congestion, dental problem, ear pain, nosebleeds, postnasal drip, rhinorrhea, sinus pressure, sore throat, trouble swallowing and voice change.   Eyes: Negative for visual disturbance.  Respiratory: Positive for cough. Negative for choking and shortness of breath.   Cardiovascular: Negative for chest pain and leg swelling.  Gastrointestinal: Positive for vomiting. Negative for abdominal pain and diarrhea.  Genitourinary: Negative for difficulty urinating.  Musculoskeletal: Negative for arthralgias.  Skin: Negative for rash.  Neurological: Positive for headaches. Negative for tremors and syncope.  Hematological: Does not bruise/bleed easily.       Objective:   Physical Exam  Amb obese bf  Wt Readings from Last 3 Encounters:  07/21/13 191 lb 3.2 oz (  86.728 kg)  07/17/13 188 lb 4 oz (85.39 kg)  07/10/13 189 lb 4 oz (85.843 kg)      HEENT: nl dentition, turbinates, and orophanx. Nl external ear canals without cough reflex   NECK :  without JVD/Nodes/TM/ nl carotid upstrokes bilaterally   LUNGS: no acc muscle use, clear to A and P bilaterally without cough on insp or exp maneuvers   CV:  RRR  no s3 or murmur or increase in P2, no edema   ABD:  soft and nontender with nl excursion in the supine position. No bruits or organomegaly, bowel sounds nl  MS:  warm without deformities, calf tenderness, cyanosis or clubbing  SKIN: warm and dry without lesions    NEURO:  alert, approp, no deficits    07/17/13  1. Improved aeration lungs with minimal residual bibasilar  opacities, left greater than right, likely atelectasis. No focal  airspace opacities to suggest pneumonia.  2. Moderate to severe scoliotic curvature of the thoracolumbar  spine.        Assessment & Plan:

## 2013-07-23 NOTE — Assessment & Plan Note (Signed)

## 2013-07-24 ENCOUNTER — Telehealth: Payer: Self-pay | Admitting: Internal Medicine

## 2013-07-24 ENCOUNTER — Encounter: Payer: Self-pay | Admitting: *Deleted

## 2013-07-24 NOTE — Telephone Encounter (Signed)
Pt returned call.  Holly D Pryor ° °

## 2013-07-24 NOTE — Telephone Encounter (Signed)
Pt saw MW 07/21/13: Patient Instructions      The key to effective treatment for your cough is eliminating the non-stop cycle of cough you're stuck in long enough to let your airway heal completely and then see if there is anything still making you cough once you stop the cough suppression, but this should take no more than 5 days to figure out First take delsym two tsp every 12 hours and supplement if needed with  tramadol 50 mg up to 2 every 4 hours to suppress the urge to cough at all or even clear your throat. Swallowing water or using ice chips/non mint and menthol containing candies (such as lifesavers or sugarless jolly ranchers) are also effective.  You should rst your voice and avoid activities that you know make you cough. Once you have eliminated the cough for 3 straight days try reducing the tramadol first,  then the delsym as tolerated.   Prednisone 10 mg take  4 each am x 2 days,   2 each am x 2 days,  1 each am x 2 days and stop (this is to eliminate allergies and inflammation from coughing) Protonix (pantoprazole) Take 30-60 min before first meal of the day and Pepcid 20 mg one bedtime plus chlorpheniramine 4 mg x 2 at bedtime (both available over the counter)  until cough is completely gone for at least a week without the need for cough suppression GERD (REFLUX)  is an extremely common cause of respiratory symptoms, many times with no significant heartburn at all.   It can be treated with medication, but also with lifestyle changes including avoidance of late meals, excessive alcohol, smoking cessation, and avoid fatty foods, chocolate, peppermint, colas, red wine, and acidic juices such as orange juice.   NO MINT OR MENTHOL PRODUCTS SO NO COUGH DROPS  USE HARD CANDY INSTEAD (jolley ranchers or Stover's or Lifesavers (all available in sugarless versions) NO OIL BASED VITAMINS - use powdered substitutes. Return in 2 weeks if no better  ---  Called pt LMTCB x1

## 2013-07-24 NOTE — Telephone Encounter (Signed)
Pt aware letter has been done and placed for pick up. Nothing further needed

## 2013-07-24 NOTE — Telephone Encounter (Signed)
Pt reports the routine MW placed her on is working and helping her cough. She reports she is still feeling exhausted/weak. She can't drive while taking this medication./ [place] help watch her children so she could get some rest. She is due to be back to work today but does not feel like she can. She wants to pick up the letter tomorrow. Please advise thanks

## 2013-07-24 NOTE — Telephone Encounter (Signed)
Fine with me

## 2013-10-13 ENCOUNTER — Ambulatory Visit (INDEPENDENT_AMBULATORY_CARE_PROVIDER_SITE_OTHER): Payer: Managed Care, Other (non HMO) | Admitting: Physician Assistant

## 2013-10-13 ENCOUNTER — Encounter: Payer: Self-pay | Admitting: Physician Assistant

## 2013-10-13 VITALS — BP 90/70 | HR 73 | Temp 98.3°F | Resp 16 | Wt 189.0 lb

## 2013-10-13 DIAGNOSIS — R059 Cough, unspecified: Secondary | ICD-10-CM

## 2013-10-13 DIAGNOSIS — R053 Chronic cough: Secondary | ICD-10-CM

## 2013-10-13 DIAGNOSIS — R05 Cough: Secondary | ICD-10-CM

## 2013-10-13 MED ORDER — PROMETHAZINE-DM 6.25-15 MG/5ML PO SYRP: 5.0000 mL | ORAL_SOLUTION | Freq: Four times a day (QID) | ORAL | Status: DC | PRN

## 2013-10-13 MED ORDER — TRAMADOL HCL 50 MG PO TABS: ORAL_TABLET | ORAL | Status: DC

## 2013-10-13 NOTE — Progress Notes (Signed)
Patient presents to clinic today c/o continued cough with PND.  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  At this point, patient has been on several different medications for GERD with no change in cough.  Denies ever having heartburn, globus, or nausea.  Only cough.  Patient also endorses seeing an Allergist recently who states she needed shots for relief of symptoms, as cough seemed allergic in nature.  Patient was going to begin allergy shots until she was informed that they would not allow her to give herself the shots after training.  Patient cannot afford copay for visit every 2 weeks for injection.  Is requesting to see a different allergist.   Past Medical History  Diagnosis Date  . Asthma   . Scoliosis   . Chicken pox   . Seasonal allergies   . Environmental allergies     Current Outpatient Prescriptions on File Prior to Visit  Medication Sig Dispense Refill  . albuterol (PROVENTIL HFA;VENTOLIN HFA) 108 (90 BASE) MCG/ACT inhaler Inhale 2 puffs into the lungs every 4 (four) hours as needed for wheezing or shortness of breath.  1 Inhaler  0  . famotidine (PEPCID) 20 MG tablet One at bedtime  30 tablet  3  . montelukast (SINGULAIR) 10 MG tablet Take 1 tablet (10 mg total) by mouth at bedtime.  30 tablet  3  . naproxen (NAPROSYN) 375 MG tablet Take 1 tablet (375 mg total) by mouth 2 (two) times daily.  20 tablet  0  . pantoprazole (PROTONIX) 40 MG tablet Take 1 tablet (40 mg total) by mouth daily. Take 30-60 min before first meal of the day  30 tablet  2  . ondansetron (ZOFRAN ODT) 8 MG disintegrating tablet Take 1 tablet (8 mg total) by mouth every 8 (eight) hours as needed for nausea or vomiting.  20 tablet  0   No current facility-administered medications on file prior to visit.    Allergies  Allergen Reactions  . Shellfish Allergy Anaphylaxis    Family History  Problem Relation Age of Onset  . Hypertension  Father     Living  . Hypertension Paternal Grandfather   . Diabetes Maternal Grandmother   . Diabetes Paternal Grandfather   . Healthy Mother     Living  . Alzheimer's disease Paternal Grandfather   . Hypertension Other     Paternal Aunts & Uncles  . Cancer Neg Hx   . Healthy Brother     x1  . Healthy Sister     x1  . Allergies Daughter   . Healthy Son   . Asthma Father   . Asthma Paternal Grandfather     History   Social History  . Marital Status: Single    Spouse Name: N/A    Number of Children: N/A  . Years of Education: N/A   Occupational History  . Pharmacy Tech    Social History Main Topics  . Smoking status: Never Smoker   . Smokeless tobacco: Never Used  . Alcohol Use: Yes     Comment: every other wkend  . Drug Use: No  . Sexual Activity: Not Currently    Birth Control/ Protection: Surgical   Other Topics Concern  . None   Social History Narrative  . None   Review of Systems - See HPI.  All other ROS are negative.  BP 90/70  Pulse 73  Temp(Src) 98.3 F (36.8 C) (Oral)  Resp 16  Wt 189 lb (85.73 kg)  SpO2 99%  Physical Exam  Vitals reviewed. Constitutional: She is oriented to person, place, and time and well-developed, well-nourished, and in no distress.  HENT:  Head: Normocephalic and atraumatic.  Right Ear: External ear normal.  Left Ear: External ear normal.  Nose: Nose normal.  Mouth/Throat: Oropharynx is clear and moist. No oropharyngeal exudate.  TM within normal limits. No TTP of sinuses on examination.  Eyes: Conjunctivae are normal. Pupils are equal, round, and reactive to light.  Neck: Neck supple.  Cardiovascular: Normal rate, regular rhythm, normal heart sounds and intact distal pulses.   Pulmonary/Chest: Effort normal and breath sounds normal. No respiratory distress. She has no wheezes. She has no rales. She exhibits no tenderness.  Lymphadenopathy:    She has no cervical adenopathy.  Neurological: She is alert and oriented  to person, place, and time.  Skin: Skin is warm and dry. No rash noted.  Psychiatric: Affect normal.    Recent Results (from the past 2160 hour(s))  TSH     Status: None   Collection Time    07/17/13 10:36 AM      Result Value Ref Range   TSH 0.852  0.350 - 4.500 uIU/mL  LIPID PANEL     Status: None   Collection Time    07/17/13 10:36 AM      Result Value Ref Range   Cholesterol 142  0 - 200 mg/dL   Comment: ATP III Classification:           < 200        mg/dL        Desirable          200 - 239     mg/dL        Borderline High          >= 240        mg/dL        High         Triglycerides 61  <150 mg/dL   HDL 45  >82>39 mg/dL   Total CHOL/HDL Ratio 3.2     VLDL 12  0 - 40 mg/dL   LDL Cholesterol 85  0 - 99 mg/dL   Comment:       Total Cholesterol/HDL Ratio:CHD Risk                            Coronary Heart Disease Risk Table                                            Men       Women              1/2 Average Risk              3.4        3.3                  Average Risk              5.0        4.4               2X Average Risk              9.6        7.1  3X Average Risk             23.4       11.0     Use the calculated Patient Ratio above and the CHD Risk table      to determine the patient's CHD Risk.     ATP III Classification (LDL):           < 100        mg/dL         Optimal          100 - 129     mg/dL         Near or Above Optimal          130 - 159     mg/dL         Borderline High          160 - 189     mg/dL         High           > 190        mg/dL         Very High        HEPATIC FUNCTION PANEL     Status: None   Collection Time    07/17/13 10:36 AM      Result Value Ref Range   Total Bilirubin 0.3  0.2 - 1.2 mg/dL   Bilirubin, Direct 0.1  0.0 - 0.3 mg/dL   Indirect Bilirubin 0.2  0.2 - 1.2 mg/dL   Alkaline Phosphatase 83  39 - 117 U/L   AST 14  0 - 37 U/L   ALT 8  0 - 35 U/L   Total Protein 7.1  6.0 - 8.3 g/dL   Albumin 3.9  3.5 - 5.2  g/dL  CBC WITH DIFFERENTIAL     Status: None   Collection Time    07/17/13 11:05 AM      Result Value Ref Range   WBC 7.1  4.0 - 10.5 K/uL   RBC 4.91  3.87 - 5.11 Mil/uL   Hemoglobin 14.0  12.0 - 15.0 g/dL   HCT 16.1  09.6 - 04.5 %   MCV 87.8  78.0 - 100.0 fl   MCHC 32.4  30.0 - 36.0 g/dL   RDW 40.9  81.1 - 91.4 %   Platelets 228.0  150.0 - 400.0 K/uL   Neutrophils Relative % 63.4  43.0 - 77.0 %   Lymphocytes Relative 23.7  12.0 - 46.0 %   Monocytes Relative 8.6  3.0 - 12.0 %   Eosinophils Relative 4.0  0.0 - 5.0 %   Basophils Relative 0.3  0.0 - 3.0 %   Neutro Abs 4.5  1.4 - 7.7 K/uL   Lymphs Abs 1.7  0.7 - 4.0 K/uL   Monocytes Absolute 0.6  0.1 - 1.0 K/uL   Eosinophils Absolute 0.3  0.0 - 0.7 K/uL   Basophils Absolute 0.0  0.0 - 0.1 K/uL  HEMOGLOBIN A1C     Status: None   Collection Time    07/17/13 11:05 AM      Result Value Ref Range   Hemoglobin A1C 5.2  4.6 - 6.5 %   Comment: Glycemic Control Guidelines for People with Diabetes:Non Diabetic:  <6%Goal of Therapy: <7%Additional Action Suggested:  >8%   TSH     Status: None   Collection Time    07/17/13 11:05 AM      Result Value Ref Range  TSH 0.55  0.35 - 4.50 uIU/mL  LIPID PANEL     Status: None   Collection Time    07/17/13 11:05 AM      Result Value Ref Range   Cholesterol 154  0 - 200 mg/dL   Comment: ATP III Classification       Desirable:  < 200 mg/dL               Borderline High:  200 - 239 mg/dL          High:  > = 009 mg/dL   Triglycerides 38.1  0.0 - 149.0 mg/dL   Comment: Normal:  <829 mg/dLBorderline High:  150 - 199 mg/dL   HDL 93.71  >69.67 mg/dL   VLDL 89.3  0.0 - 81.0 mg/dL   LDL Cholesterol 97  0 - 99 mg/dL   Total CHOL/HDL Ratio 4     Comment:                Men          Women1/2 Average Risk     3.4          3.3Average Risk          5.0          4.42X Average Risk          9.6          7.13X Average Risk          15.0          11.0                      HEPATIC FUNCTION PANEL     Status: None    Collection Time    07/17/13 11:05 AM      Result Value Ref Range   Total Bilirubin 0.6  0.2 - 1.2 mg/dL   Bilirubin, Direct 0.0  0.0 - 0.3 mg/dL   Alkaline Phosphatase 84  39 - 117 U/L   AST 23  0 - 37 U/L   ALT 11  0 - 35 U/L   Total Protein 8.1  6.0 - 8.3 g/dL   Albumin 3.9  3.5 - 5.2 g/dL   Assessment/Plan: Chronic cough Medications refilled. Encouraged addition of Nasacort.  Referral placed to [facility] for evaluation by their ENT and Allergists.  Patient to follow-up with Pulmonology.

## 2013-10-13 NOTE — Patient Instructions (Signed)
Please continue medications as directed.  Use daily Nasacort (This is over-the-counter). Stay well hydrated.  You will be contacted by an ENT/Allergist Office for an appointment.   For weight -- eat more frequent meals/snacks (every 3-4 hours), but limit portion sizes. Continue your Zumba plan.  Follow-up in 1-2 months.  If weight loss is not occurring, we can start a medication to help.  Serving Sizes What we call a serving size today is larger than it was in the past. A 1950s fast-food burger contained little more than 1 oz of meat, and a soft drink was 8 oz (1 cup). Today, a "quarter pounder" burger is at least 4 times that amount, and a 32 or 64 oz drink is not uncommon. A possible guide for eating when trying to lose weight is to eat about half as much as you normally do. Some estimates of serving sizes are:  1 Dairy serving:Individual container of yogurt (8 oz) or piece of cheese the size of your thumb (1 oz).  1 Grain serving: 1 slice of bread or  cup pasta.  1 Meat serving: The size of a deck of cards (3 oz).  1 Fruit serving: cup canned fruit or 1 medium fruit.  1 Vegetable serving:  cup of cooked or canned vegetables.  1 Fat serving:The size of 4 stacked dimes. Experts suggest spending 1 or 2 days measuring food portions you commonly eat. This will give you better practice at estimating serving sizes, and will also show whether you are eating an appropriate amount of food to meet your weight goals. If you find that you are eating more than you thought, try measuring your food for a few days so you can "reprogram" yourself to learn what makes a healthy portion for you. SUGGESTIONS FOR CONTROL  In restaurants, share entrees, or ask the waiter to put half the entre in a box or bag before you even touch it.  Order lunch-sized portions. Many restaurants serve 4 to 6 oz of meat at lunch, compared with 8 to 10 oz at dinner.  Split dessert or skip it all together. Have a piece of  fruit when you get home.  At home, use smaller plates and bowls. It will look as if you are eating more.  Plate your food in the kitchen rather than serving it "family style" at the table.  Wait 20 to 30 minutes before taking seconds. This is how long it takes your brain to recognize that you are full.  Check food labels for serving sizes. Eat 1 serving only.  Use measuring cups and spoons to see proper serving sizes.  Buy smaller packages of candy, popcorn, and snacks.  Avoid eating directly out of the bag or carton.  While eating half as much, exercise twice as much. Park further away from the mall, take the stairs instead of the escalator, and walk around your block. Losing weight is a slow, difficult process. It takes long-lasting lifestyle changes. You can make gradual changes over time so they become habits. Look to friends and family to support the healthy changes you are making. Avoid fad diets since they are often only temporary weight loss solutions. Document Released: 11/22/2002 Document Revised: 05/18/2011 Document Reviewed: 05/23/2013 [facility] Patient Information 2015 [place], [place]. This information is not intended to replace advice given to you by your health care provider. Make sure you discuss any questions you have with your health care provider.

## 2013-10-13 NOTE — Assessment & Plan Note (Signed)
Medications refilled. Encouraged addition of Nasacort.  [facility] for evaluation by their ENT and Allergists.  Patient to follow-up with Pulmonology.

## 2013-10-13 NOTE — Progress Notes (Signed)
Pre visit review using our clinic review tool, if applicable. No additional management support is needed unless otherwise documented below in the visit note. 

## 2013-11-17 ENCOUNTER — Telehealth: Payer: Self-pay | Admitting: Physician Assistant

## 2013-11-17 NOTE — Telephone Encounter (Signed)
Will not give narcotic medications chronically for cough due to abuse and habit-forming potential.  I can send in an alternative if she wishes.  Otherwise she needs to be seen in clinic or follow-up with her ENT/Allergist.

## 2013-11-17 NOTE — Telephone Encounter (Signed)
Caller name: Daenerys Relation to pt: self  Call back number: [phone] Pharmacy: [facility]   Reason for call:   pt is coughing really bad requesting a refill traMADol (ULTRAM) 50 MG tablet pt will be sent home if coughing persist. Pt stated she is not sick its just her allergies but patients keep complaining. Pt works at [employer].

## 2013-11-17 NOTE — Telephone Encounter (Signed)
Pt states she would like for you to send in an alternative for her today.

## 2013-11-20 MED ORDER — PROMETHAZINE-DM 6.25-15 MG/5ML PO SYRP: 5.0000 mL | ORAL_SOLUTION | Freq: Four times a day (QID) | ORAL | Status: DC | PRN

## 2013-11-20 NOTE — Telephone Encounter (Signed)
Promethazine has been sent to pharmacy. If she requires something with codeine in it, she will have to come to office for pick up.  Please inform patient medication is waiting at pharmacy.

## 2013-11-20 NOTE — Telephone Encounter (Signed)
Pt following up, would like to know if Holly House has called anything into the pharmacy for her cough.

## 2013-11-22 ENCOUNTER — Encounter: Payer: Self-pay | Admitting: Physician Assistant

## 2013-11-22 ENCOUNTER — Ambulatory Visit (INDEPENDENT_AMBULATORY_CARE_PROVIDER_SITE_OTHER): Payer: Managed Care, Other (non HMO) | Admitting: Physician Assistant

## 2013-11-22 VITALS — BP 125/79 | HR 93 | Temp 98.7°F | Resp 16 | Ht 62.0 in | Wt 188.1 lb

## 2013-11-22 DIAGNOSIS — J019 Acute sinusitis, unspecified: Secondary | ICD-10-CM

## 2013-11-22 DIAGNOSIS — B9689 Other specified bacterial agents as the cause of diseases classified elsewhere: Secondary | ICD-10-CM | POA: Insufficient documentation

## 2013-11-22 MED ORDER — HYDROCODONE-HOMATROPINE 5-1.5 MG/5ML PO SYRP: 5.0000 mL | ORAL_SOLUTION | Freq: Three times a day (TID) | ORAL | Status: DC | PRN

## 2013-11-22 MED ORDER — AMOXICILLIN-POT CLAVULANATE 875-125 MG PO TABS: 1.0000 | ORAL_TABLET | Freq: Two times a day (BID) | ORAL | Status: DC

## 2013-11-22 NOTE — Patient Instructions (Signed)
Please take antibiotic as directed.  Increase fluid intake.  Use Saline nasal spray.  Take a daily multivitamin. Use hycodan as directed.  Continue your allergy medications.  Place a humidifier in the bedroom.  Please call or return clinic if symptoms are not improving.  Sinusitis Sinusitis is redness, soreness, and swelling (inflammation) of the paranasal sinuses. Paranasal sinuses are air pockets within the bones of your face (beneath the eyes, the middle of the forehead, or above the eyes). In healthy paranasal sinuses, mucus is able to drain out, and air is able to circulate through them by way of your nose. However, when your paranasal sinuses are inflamed, mucus and air can become trapped. This can allow bacteria and other germs to grow and cause infection. Sinusitis can develop quickly and last only a short time (acute) or continue over a long period (chronic). Sinusitis that lasts for more than 12 weeks is considered chronic.  CAUSES  Causes of sinusitis include:  Allergies.  Structural abnormalities, such as displacement of the cartilage that separates your nostrils (deviated septum), which can decrease the air flow through your nose and sinuses and affect sinus drainage.  Functional abnormalities, such as when the small hairs (cilia) that line your sinuses and help remove mucus do not work properly or are not present. SYMPTOMS  Symptoms of acute and chronic sinusitis are the same. The primary symptoms are pain and pressure around the affected sinuses. Other symptoms include:  Upper toothache.  Earache.  Headache.  Bad breath.  Decreased sense of smell and taste.  A cough, which worsens when you are lying flat.  Fatigue.  Fever.  Thick drainage from your nose, which often is green and may contain pus (purulent).  Swelling and warmth over the affected sinuses. DIAGNOSIS  Your caregiver will perform a physical exam. During the exam, your caregiver may:  Look in your nose  for signs of abnormal growths in your nostrils (nasal polyps).  Tap over the affected sinus to check for signs of infection.  View the inside of your sinuses (endoscopy) with a special imaging device with a light attached (endoscope), which is inserted into your sinuses. If your caregiver suspects that you have chronic sinusitis, one or more of the following tests may be recommended:  Allergy tests.  Nasal culture A sample of mucus is taken from your nose and sent to a lab and screened for bacteria.  Nasal cytology A sample of mucus is taken from your nose and examined by your caregiver to determine if your sinusitis is related to an allergy. TREATMENT  Most cases of acute sinusitis are related to a viral infection and will resolve on their own within 10 days. Sometimes medicines are prescribed to help relieve symptoms (pain medicine, decongestants, nasal steroid sprays, or saline sprays).  However, for sinusitis related to a bacterial infection, your caregiver will prescribe antibiotic medicines. These are medicines that will help kill the bacteria causing the infection.  Rarely, sinusitis is caused by a fungal infection. In theses cases, your caregiver will prescribe antifungal medicine. For some cases of chronic sinusitis, surgery is needed. Generally, these are cases in which sinusitis recurs more than 3 times per year, despite other treatments. HOME CARE INSTRUCTIONS   Drink plenty of water. Water helps thin the mucus so your sinuses can drain more easily.  Use a humidifier.  Inhale steam 3 to 4 times a day (for example, sit in the bathroom with the shower running).  Apply a warm, moist washcloth  to your face 3 to 4 times a day, or as directed by your caregiver.  Use saline nasal sprays to help moisten and clean your sinuses.  Take over-the-counter or prescription medicines for pain, discomfort, or fever only as directed by your caregiver. SEEK IMMEDIATE MEDICAL CARE IF:  You  have increasing pain or severe headaches.  You have nausea, vomiting, or drowsiness.  You have swelling around your face.  You have vision problems.  You have a stiff neck.  You have difficulty breathing. MAKE SURE YOU:   Understand these instructions.  Will watch your condition.  Will get help right away if you are not doing well or get worse. Document Released: 02/23/2005 Document Revised: 05/18/2011 Document Reviewed: 03/10/2011 [facility] Patient Information 2014 [place], [place].

## 2013-11-22 NOTE — Progress Notes (Signed)
Pre visit review using our clinic review tool, if applicable. No additional management support is needed unless otherwise documented below in the visit note/SLS  

## 2013-11-22 NOTE — Progress Notes (Signed)
Patient presents to clinic today c/o fever, chills, sinus pressure, sinus pain, ear pressure and ear pain.  Has also been having some tooth pain over the past week.  Denies chest pain or shortness of breath.  Denies recent travel or sick contact.  Patient with history of significant seasonal allergies -- Currently on Singulair, Xyzal and Ranitidine.  Past Medical History  Diagnosis Date  . Asthma   . Scoliosis   . Chicken pox   . Seasonal allergies   . Environmental allergies     Current Outpatient Prescriptions on File Prior to Visit  Medication Sig Dispense Refill  . albuterol (PROVENTIL HFA;VENTOLIN HFA) 108 (90 BASE) MCG/ACT inhaler Inhale 2 puffs into the lungs every 4 (four) hours as needed for wheezing or shortness of breath.  1 Inhaler  0  . levocetirizine (XYZAL) 5 MG tablet Take 5 mg by mouth every evening.      . montelukast (SINGULAIR) 10 MG tablet Take 1 tablet (10 mg total) by mouth at bedtime.  30 tablet  3  . ondansetron (ZOFRAN ODT) 8 MG disintegrating tablet Take 1 tablet (8 mg total) by mouth every 8 (eight) hours as needed for nausea or vomiting.  20 tablet  0  . traMADol (ULTRAM) 50 MG tablet 1-2 every 4 hours as needed for cough or pain  40 tablet  0   No current facility-administered medications on file prior to visit.    Allergies  Allergen Reactions  . Shellfish Allergy Anaphylaxis    Family History  Problem Relation Age of Onset  . Hypertension Father     Living  . Hypertension Paternal Grandfather   . Diabetes Maternal Grandmother   . Diabetes Paternal Grandfather   . Healthy Mother     Living  . Alzheimer's disease Paternal Grandfather   . Hypertension Other     Paternal Aunts & Uncles  . Cancer Neg Hx   . Healthy Brother     x1  . Healthy Sister     x1  . Allergies Daughter   . Healthy Son   . Asthma Father   . Asthma Paternal Grandfather     History   Social History  . Marital Status: Single    Spouse Name: N/A    Number of  Children: N/A  . Years of Education: N/A   Occupational History  . Pharmacy Tech    Social History Main Topics  . Smoking status: Never Smoker   . Smokeless tobacco: Never Used  . Alcohol Use: Yes     Comment: every other wkend  . Drug Use: No  . Sexual Activity: Not Currently    Birth Control/ Protection: Surgical   Other Topics Concern  . None   Social History Narrative  . None   Review of Systems - See HPI.  All other ROS are negative.  BP 125/79  Pulse 93  Temp(Src) 98.7 F (37.1 C) (Oral)  Resp 16  Ht  (1.575 m)  Wt 188 lb 2 oz (85.333 kg)  BMI 34.40 kg/m2  SpO2 100%  LMP 11/05/2013  Physical Exam  Vitals reviewed. Constitutional: She is oriented to person, place, and time and well-developed, well-nourished, and in no distress.  HENT:  Head: Normocephalic and atraumatic.  Right Ear: Tympanic membrane, external ear and ear canal normal.  Left Ear: Tympanic membrane, external ear and ear canal normal.  Nose: Mucosal edema present. Right sinus exhibits maxillary sinus tenderness. Right sinus exhibits no frontal sinus tenderness. Left  sinus exhibits maxillary sinus tenderness. Left sinus exhibits no frontal sinus tenderness.  Mouth/Throat: Uvula is midline, oropharynx is clear and moist and mucous membranes are normal. No oropharyngeal exudate.  Eyes: Conjunctivae are normal. Pupils are equal, round, and reactive to light.  Neck: Neck supple.  Cardiovascular: Normal rate, regular rhythm, normal heart sounds and intact distal pulses.   Pulmonary/Chest: Effort normal and breath sounds normal. No respiratory distress. She has no wheezes. She has no rales. She exhibits no tenderness.  Lymphadenopathy:    She has no cervical adenopathy.  Neurological: She is alert and oriented to person, place, and time.  Skin: Skin is warm and dry. No rash noted.  Psychiatric: Affect normal.   Assessment/Plan: Acute bacterial sinusitis Rx Augmentin. Increase fluids.  Rest.   Saline nasal spray.  Continue allergy medications as directed.  Rx hycodan syrup for cough.  Humidifier in bedroom.  Return precautions discussed with patient.

## 2013-11-22 NOTE — Assessment & Plan Note (Signed)
Rx Augmentin. Increase fluids.  Rest.  Saline nasal spray.  Continue allergy medications as directed.  Rx hycodan syrup for cough.  Humidifier in bedroom.  Return precautions discussed with patient.

## 2013-12-11 ENCOUNTER — Telehealth: Payer: Self-pay | Admitting: Physician Assistant

## 2013-12-11 MED ORDER — BECLOMETHASONE DIPROPIONATE 80 MCG/ACT IN AERS: 2.0000 | INHALATION_SPRAY | Freq: Two times a day (BID) | RESPIRATORY_TRACT | Status: AC

## 2013-12-11 NOTE — Telephone Encounter (Signed)
Caller name: Reniyah Relation to pt: self Call back number: [phone] Pharmacy: [facility] in [place]  Reason for call:   Patient would like to switch to Advair to see if this helps her asthma

## 2013-12-11 NOTE — Telephone Encounter (Signed)
Spoke with patient to inquire about Advair request, as there was only her Albuterol [rescue HFA] listed on her active medication list; pt reported that she also has QVAR 80 and uses it daily with [2] puffs in the AM & [2] puffs in the PM without relief from Asthma symptoms, especially consistent cough that is worsening. Patient states that she was with family this past weekend [who most all have Asthma also] and they informed her that they use Advair and that she should inquire bout this change to her PCP. Explained to patient that Selena BattenCody is out of office until Friday and attempted to make appointment for her to be seen then, but pt states that she has no money for office visit. Is this something that we could address remotely and/or does pt need OV before change could be made.? Thanks/SLS

## 2013-12-11 NOTE — Telephone Encounter (Signed)
Unfortunately, she really needs office evaluation first to rule out pneumonia. May require oral steroids to treat asthma.  She can be seen in urgent care or our office.   Whichever is best for her.

## 2013-12-12 NOTE — Telephone Encounter (Signed)
Pt called back and said that she was asleep when you called and she forgot what was discussed.  Relayed phone note to patient.  She remembered; stated that the coughing is making her almost vomit. Offered appointment, but patient stated she did not have copay.  Offered to bill copay to get her in the office to be seen, but she said she had to go to work.  Advised her to follow your Sharon's advice.  Pt understood.

## 2013-12-12 NOTE — Telephone Encounter (Signed)
Patient informed, understood; declined appointment offered and states she does not get paid until next Friday [does not have co-pay]. Reiterated if any emergent symptoms occur [abnormal SOB, chest/arm pain, nausea/vomiting, lightheaded/dizziness] to report to UC/ED for A&E to r/o more serious issue; pt agreed and will call back to schedule office appt/SLS

## 2013-12-22 ENCOUNTER — Encounter: Payer: Self-pay | Admitting: Physician Assistant

## 2013-12-22 ENCOUNTER — Ambulatory Visit (INDEPENDENT_AMBULATORY_CARE_PROVIDER_SITE_OTHER): Payer: Managed Care, Other (non HMO) | Admitting: Physician Assistant

## 2013-12-22 VITALS — BP 116/69 | HR 72 | Temp 98.4°F | Resp 16 | Ht 62.0 in | Wt 192.5 lb

## 2013-12-22 DIAGNOSIS — R05 Cough: Secondary | ICD-10-CM

## 2013-12-22 DIAGNOSIS — R053 Chronic cough: Secondary | ICD-10-CM

## 2013-12-22 DIAGNOSIS — Z9109 Other allergy status, other than to drugs and biological substances: Secondary | ICD-10-CM

## 2013-12-22 DIAGNOSIS — Z91048 Other nonmedicinal substance allergy status: Secondary | ICD-10-CM

## 2013-12-22 MED ORDER — METHYLPREDNISOLONE ACETATE 80 MG/ML IJ SUSP
80.0000 mg | Freq: Once | INTRAMUSCULAR | Status: AC
  Administered 2013-12-22: 80 mg via INTRAMUSCULAR

## 2013-12-22 MED ORDER — PROMETHAZINE-DM 6.25-15 MG/5ML PO SYRP: 5.0000 mL | ORAL_SOLUTION | Freq: Four times a day (QID) | ORAL | Status: AC | PRN

## 2013-12-22 MED ORDER — TRAMADOL HCL 50 MG PO TABS: 50.0000 mg | ORAL_TABLET | Freq: Four times a day (QID) | ORAL | Status: AC | PRN

## 2013-12-22 NOTE — Patient Instructions (Signed)
Please take Tramadol and Promethazine as directed.  You will be contacted for a CT of your sinuses. Please follow-up with Allergist and begin the allergy drops.  We will schedule follow-up based on your CT results.

## 2013-12-22 NOTE — Progress Notes (Signed)
Pre visit review using our clinic review tool, if applicable. No additional management support is needed unless otherwise documented below in the visit note/SLS  

## 2013-12-22 NOTE — Progress Notes (Signed)
Patient presents to clinic today c/o continued chronic cough with nasal congestion, rhinorrhea and sinus pressure.  Patient has been evaluated by Primary Care, Pulmonology, ENT x 2 and Allergist who have all come to the consensus that patient's symptoms are stemming from severe environmental allergies.  Patient states that her Allergist wanted to initially have her get allergy shots, but she refused.  Is considering Allergy drops for symptoms.  Denies new symptoms.  No OTC or Rx medication other than combination of Promethazine-DM and Tramadol have helped to quell her cough.  Patient previously with negative CXR and unremarkable PFTs. Has never had CT to assess sinuses.  Past Medical History  Diagnosis Date  . Asthma   . Scoliosis   . Chicken pox   . Seasonal allergies   . Environmental allergies     Current Outpatient Prescriptions on File Prior to Visit  Medication Sig Dispense Refill  . albuterol (PROVENTIL HFA;VENTOLIN HFA) 108 (90 BASE) MCG/ACT inhaler Inhale 2 puffs into the lungs every 4 (four) hours as needed for wheezing or shortness of breath.  1 Inhaler  0  . beclomethasone (QVAR) 80 MCG/ACT inhaler Inhale 2 puffs into the lungs 2 (two) times daily.  1 Inhaler  12  . levocetirizine (XYZAL) 5 MG tablet Take 5 mg by mouth every evening.      . montelukast (SINGULAIR) 10 MG tablet Take 1 tablet (10 mg total) by mouth at bedtime.  30 tablet  3  . naproxen (NAPROSYN) 375 MG tablet Take 375 mg by mouth as needed.      . ondansetron (ZOFRAN ODT) 8 MG disintegrating tablet Take 1 tablet (8 mg total) by mouth every 8 (eight) hours as needed for nausea or vomiting.  20 tablet  0  . ranitidine (ZANTAC) 300 MG capsule Take 300 mg by mouth every evening.       No current facility-administered medications on file prior to visit.    Allergies  Allergen Reactions  . Shellfish Allergy Anaphylaxis    Family History  Problem Relation Age of Onset  . Hypertension Father     Living  .  Hypertension Paternal Grandfather   . Diabetes Maternal Grandmother   . Diabetes Paternal Grandfather   . Healthy Mother     Living  . Alzheimer's disease Paternal Grandfather   . Hypertension Other     Paternal Aunts & Uncles  . Cancer Neg Hx   . Healthy Brother     x1  . Healthy Sister     x1  . Allergies Daughter   . Healthy Son   . Asthma Father   . Asthma Paternal Grandfather     History   Social History  . Marital Status: Single    Spouse Name: N/A    Number of Children: N/A  . Years of Education: N/A   Occupational History  . Pharmacy Tech    Social History Main Topics  . Smoking status: Never Smoker   . Smokeless tobacco: Never Used  . Alcohol Use: Yes     Comment: every other wkend  . Drug Use: No  . Sexual Activity: Not Currently    Birth Control/ Protection: Surgical   Other Topics Concern  . None   Social History Narrative  . None   Review of Systems - See HPI.  All other ROS are negative.  BP 116/69  Pulse 72  Temp(Src) 98.4 F (36.9 C) (Oral)  Resp 16  Ht 5\' 2"  (1.575 m)  Wt  192 lb 8 oz (87.317 kg)  BMI 35.20 kg/m2  SpO2 100%  LMP 11/22/2013  Physical Exam  Vitals reviewed. Constitutional: She is oriented to person, place, and time and well-developed, well-nourished, and in no distress.  HENT:  Head: Normocephalic and atraumatic.  Right Ear: Tympanic membrane, external ear and ear canal normal.  Left Ear: Tympanic membrane, external ear and ear canal normal.  Nose: Mucosal edema and rhinorrhea present. Right sinus exhibits no maxillary sinus tenderness and no frontal sinus tenderness. Left sinus exhibits no maxillary sinus tenderness and no frontal sinus tenderness.  Mouth/Throat: Uvula is midline, oropharynx is clear and moist and mucous membranes are normal.  Eyes: Conjunctivae are normal. Pupils are equal, round, and reactive to light.  Neck: Neck supple.  Cardiovascular: Normal rate, regular rhythm, normal heart sounds and intact  distal pulses.   Pulmonary/Chest: Effort normal and breath sounds normal. No respiratory distress. She has no wheezes. She has no rales. She exhibits no tenderness.  Lymphadenopathy:    She has no cervical adenopathy.  Neurological: She is alert and oriented to person, place, and time.  Skin: Skin is warm and dry. No rash noted.  Psychiatric: Affect normal.   Assessment/Plan: Multiple environmental allergies Encouraged patient to proceed with sublingual allergy drops. Continue Flonase, Singulair and Allegra. Increase fluid intake.    Chronic cough Likely Allergy and PND-induced.  Reiterated importance of following Allergist recommendations.  Rx Tramadol and Promethazine-DM for cough.  Will obtain CT Maxillofacial to assess sinuses.

## 2013-12-24 DIAGNOSIS — Z9109 Other allergy status, other than to drugs and biological substances: Secondary | ICD-10-CM | POA: Insufficient documentation

## 2013-12-24 NOTE — Assessment & Plan Note (Signed)
Encouraged patient to proceed with sublingual allergy drops. Continue Flonase, Singulair and Allegra. Increase fluid intake.

## 2013-12-24 NOTE — Assessment & Plan Note (Signed)
Likely Allergy and PND-induced.  Reiterated importance of following Allergist recommendations.  Rx Tramadol and Promethazine-DM for cough.  Will obtain CT Maxillofacial to assess sinuses.

## 2013-12-25 ENCOUNTER — Other Ambulatory Visit: Payer: Self-pay | Admitting: Physician Assistant

## 2013-12-25 ENCOUNTER — Ambulatory Visit (HOSPITAL_BASED_OUTPATIENT_CLINIC_OR_DEPARTMENT_OTHER)
Admission: RE | Admit: 2013-12-25 | Discharge: 2013-12-25 | Disposition: A | Discharge status: Home / Self Care | Payer: Managed Care, Other (non HMO) | Source: Ambulatory Visit | Attending: Physician Assistant | Admitting: Physician Assistant

## 2013-12-25 DIAGNOSIS — R053 Chronic cough: Secondary | ICD-10-CM

## 2013-12-25 DIAGNOSIS — R05 Cough: Secondary | ICD-10-CM

## 2013-12-25 DIAGNOSIS — R51 Headache: Secondary | ICD-10-CM | POA: Diagnosis not present

## 2015-06-06 IMAGING — CR DG CHEST 2V
2 series · 2 of 2 positions shown · non-contrast
Comparison: None.

CLINICAL DATA: Cough.

EXAM:
CHEST  2 VIEW

[w chest pa]
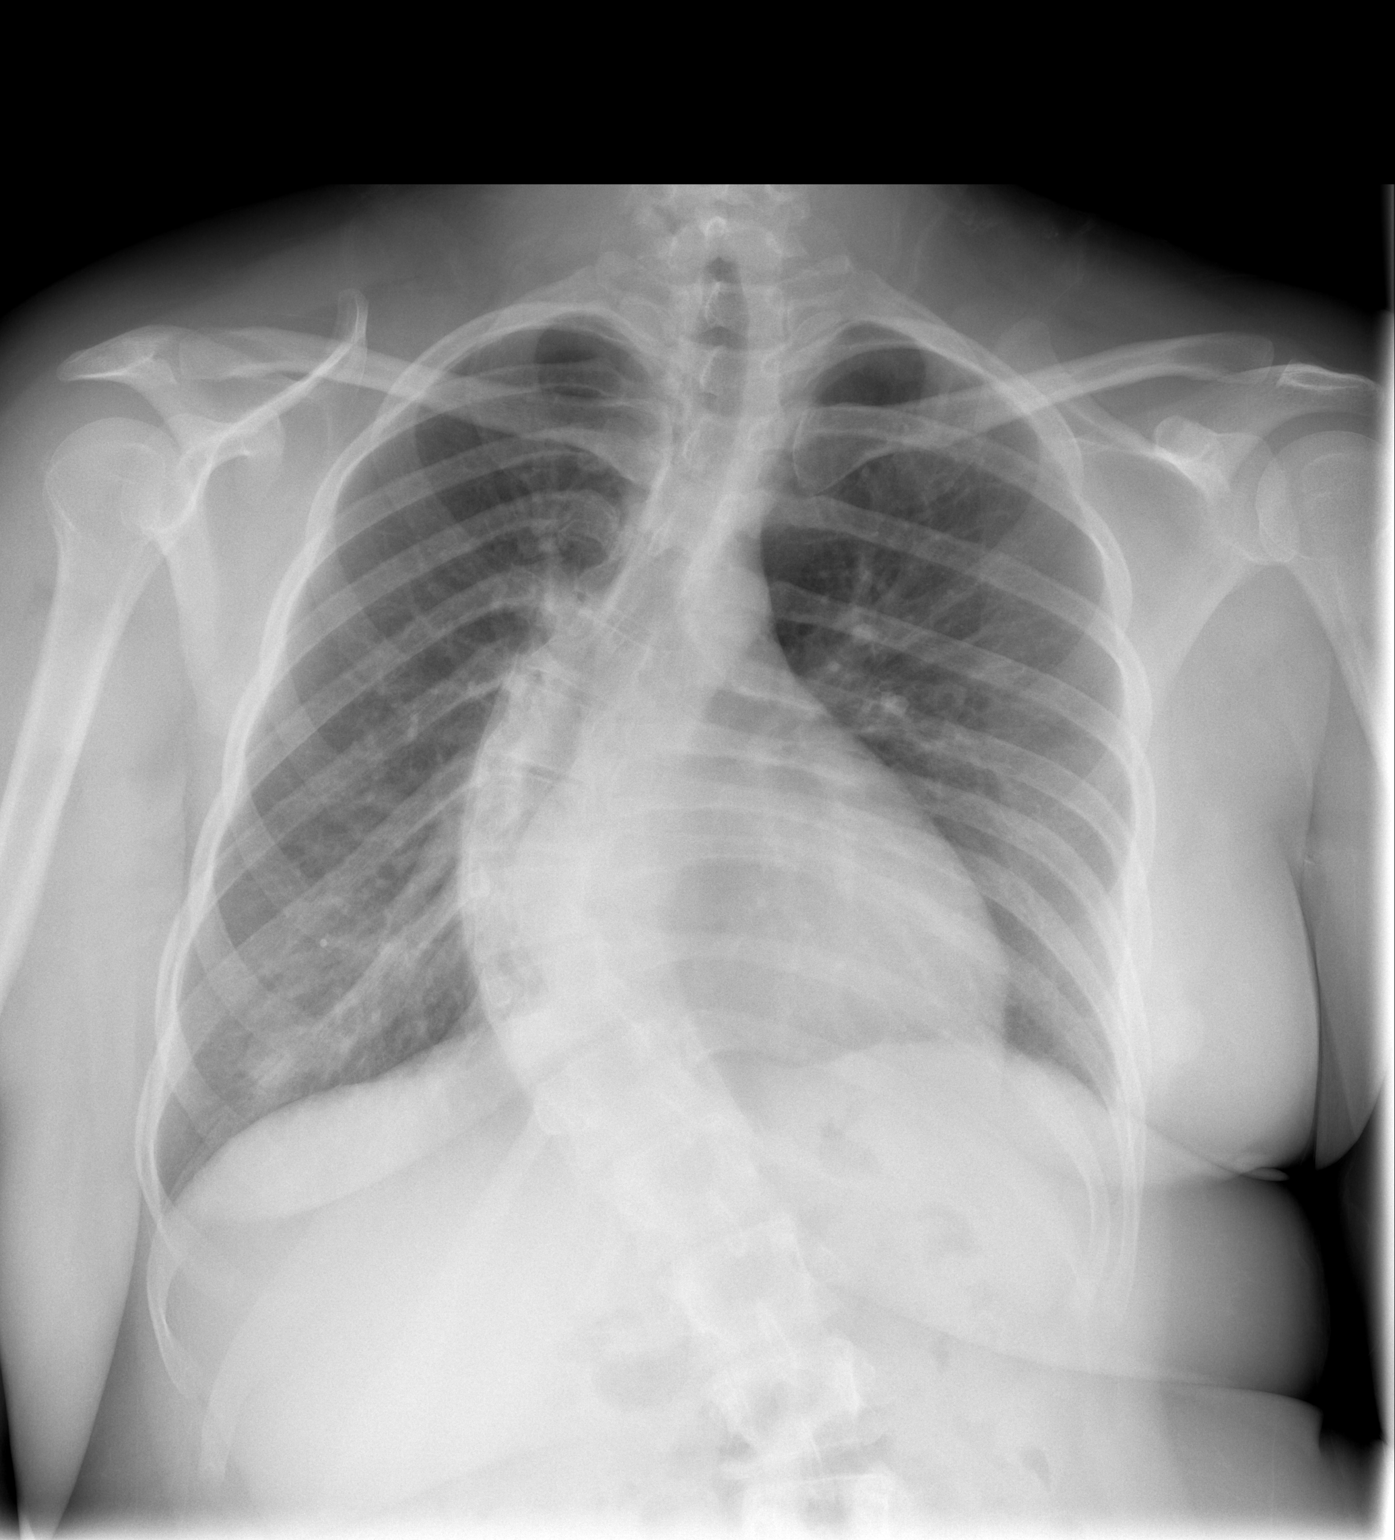

[w chest lat]
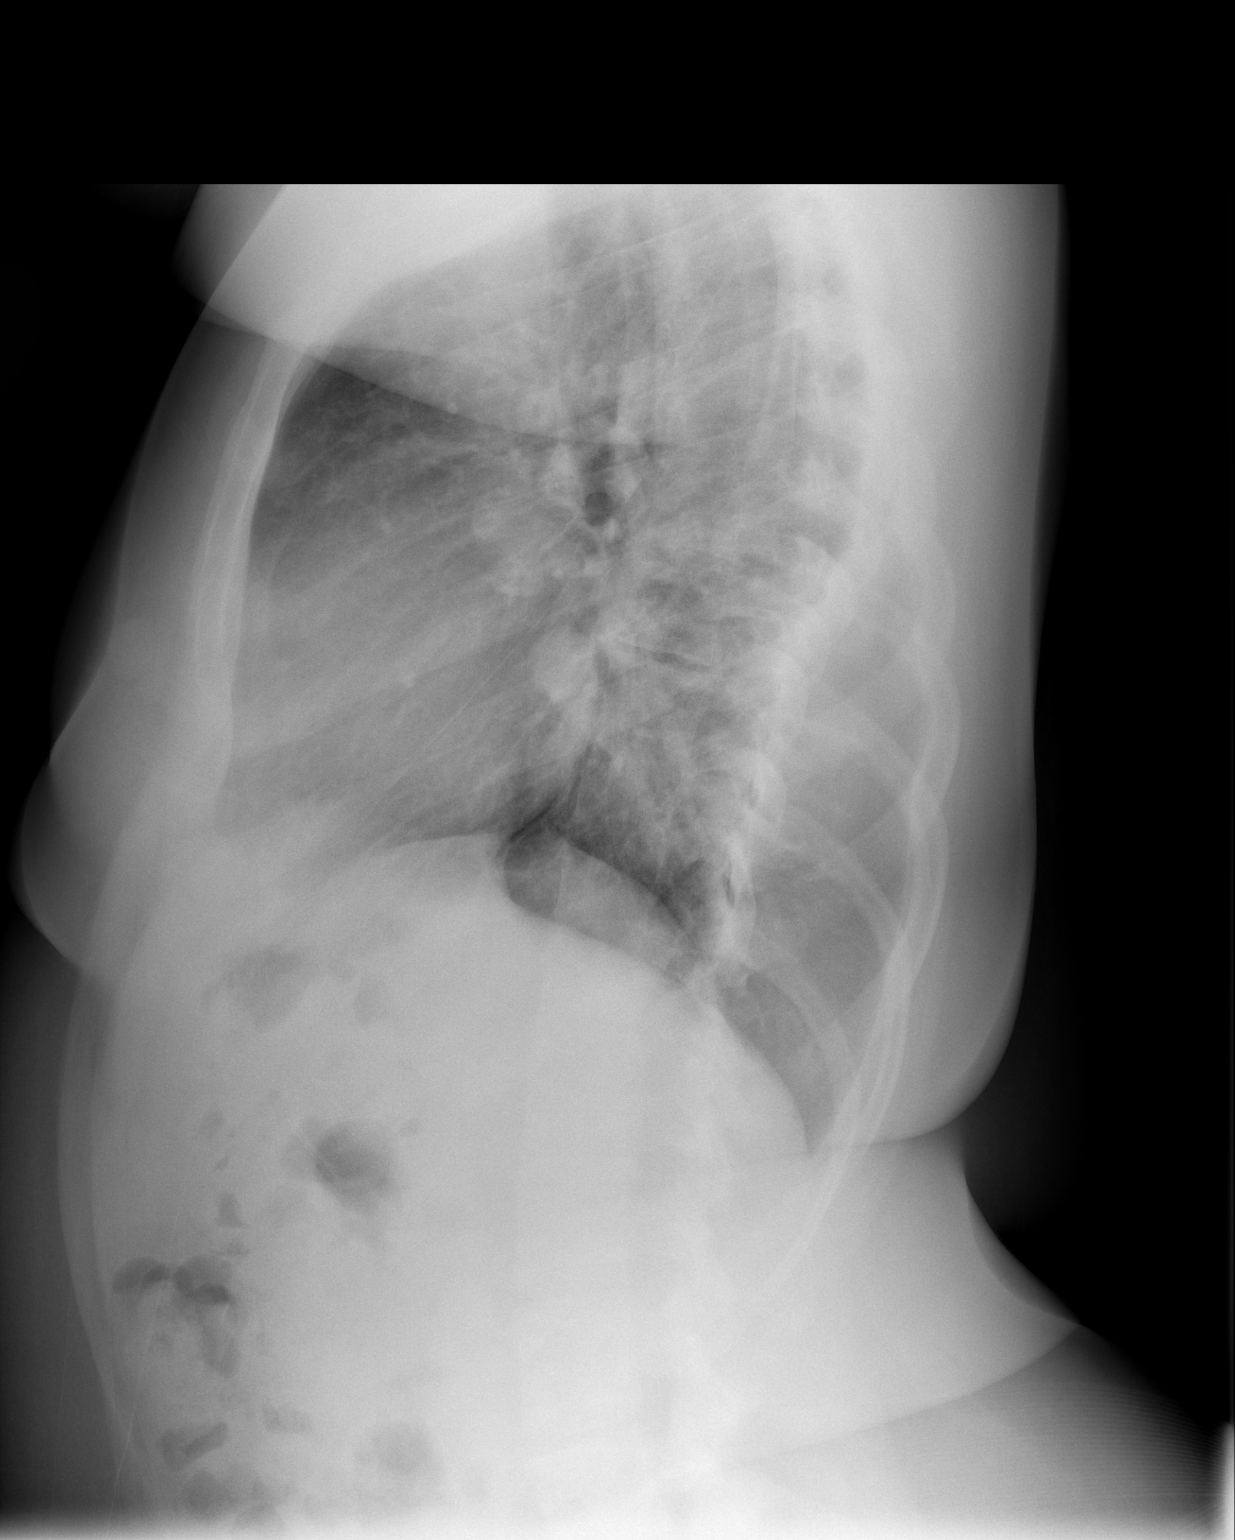

[2 of 2 positions shown; findings below may reference images not displayed]

FINDINGS: Mediastinum and hilar structures normal. Mild right base infiltrate
consistent with pneumonia noted. No pleural effusion or
pneumothorax. Mild cardiomegaly with normal pulmonary vascularity.
Prominent scoliosis thoracic spine.
IMPRESSION: 1. Mild infiltrate right lung base suggesting pneumonia.
2. Mild cardiomegaly.  No CHF.
3. Severe thoracic spine scoliosis.

## 2015-11-21 IMAGING — CT CT PARANASAL SINUSES LIMITED
1 series · 12 of 14 positions shown, 15 images · non-contrast
Comparison: None.

CLINICAL DATA: Bilateral facial pain and sinus drainage.

EXAM:
CT PARANASAL SINUS WITHOUT CONTRAST
TECHNIQUE: Multidetector CT images of the paranasal sinuses were obtained using
the standard protocol without intravenous contrast.

[Series 3: sinus prone 5.0 h31s · axial · 0.29mm/px · z∈[-155,-45]mm · 12 of 14 slices shown, 15 images]
[im 2/14  brain]
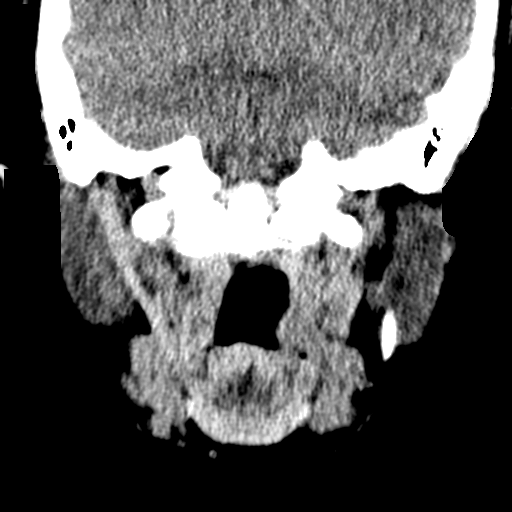
[im 2/14  bone]
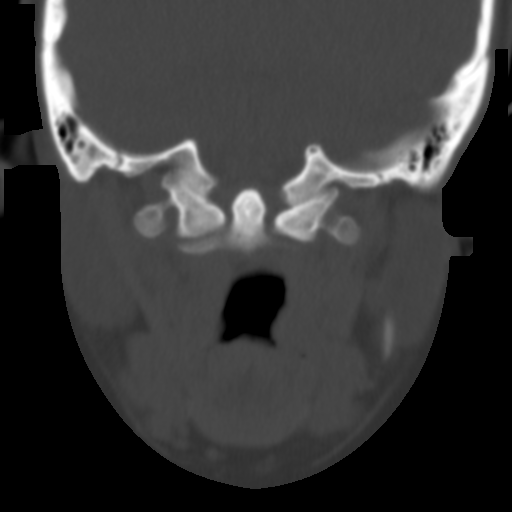
[im 3/14  bone]
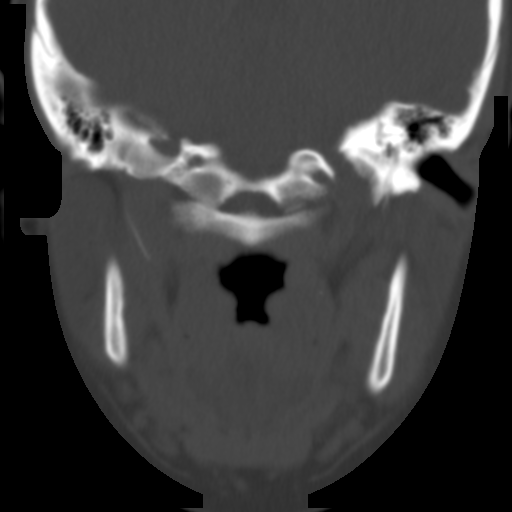
[im 4/14  bone]
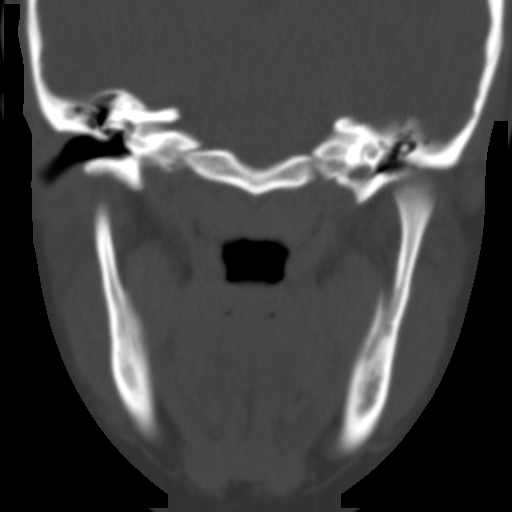
[im 5/14  bone]
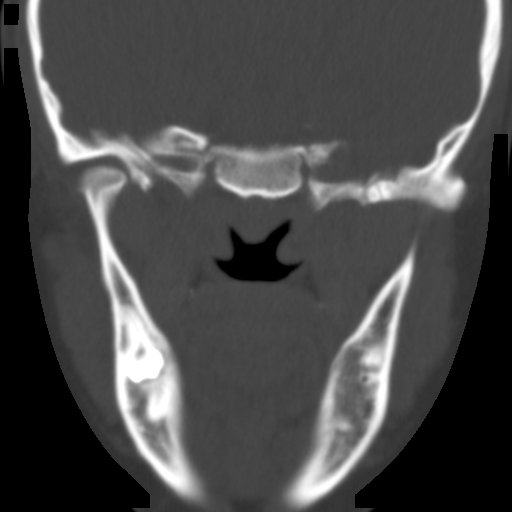
[im 6/14  brain]
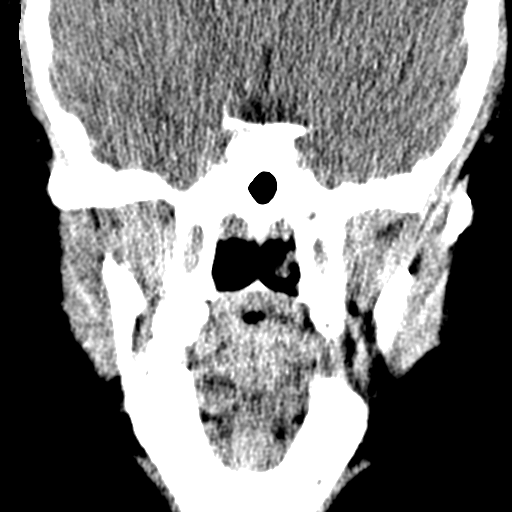
[im 6/14  bone]
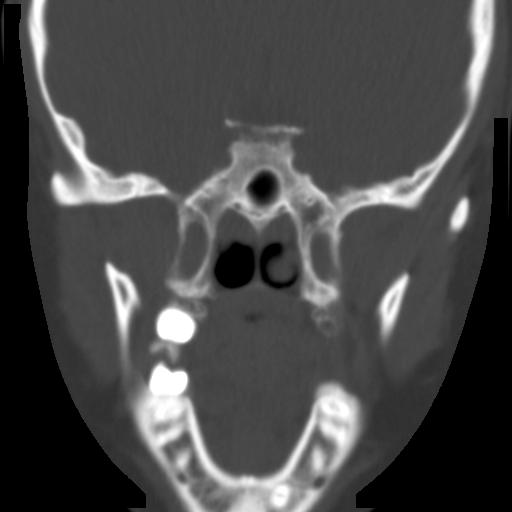
[im 7/14  bone]
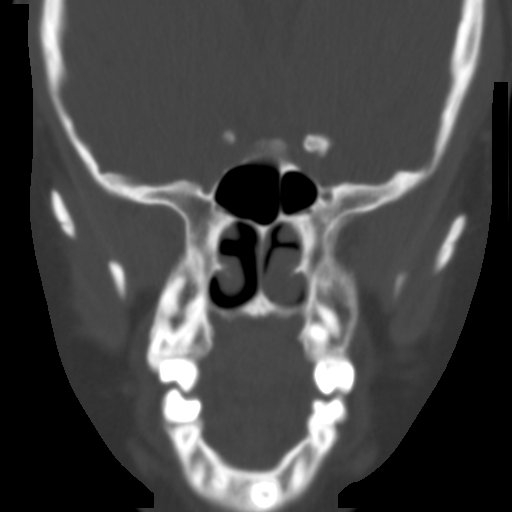
[im 8/14  bone]
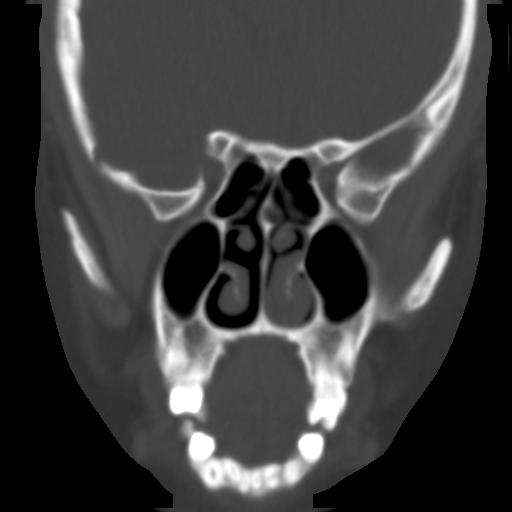
[im 9/14  bone]
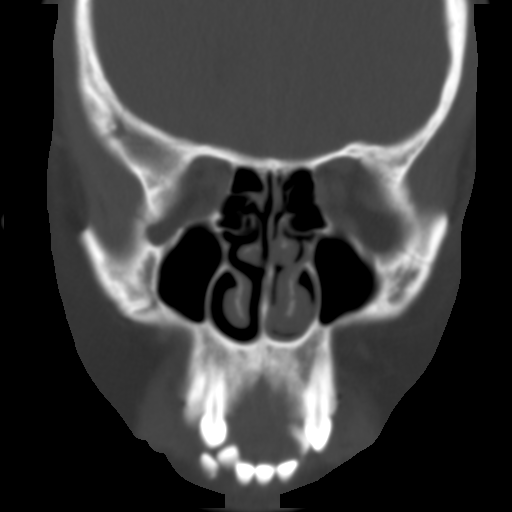
[im 10/14  brain]
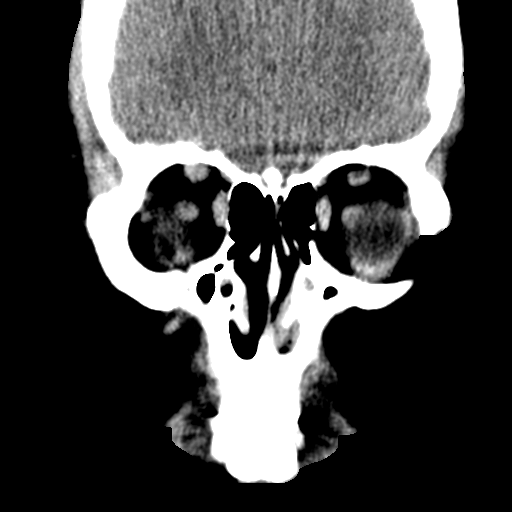
[im 10/14  bone]
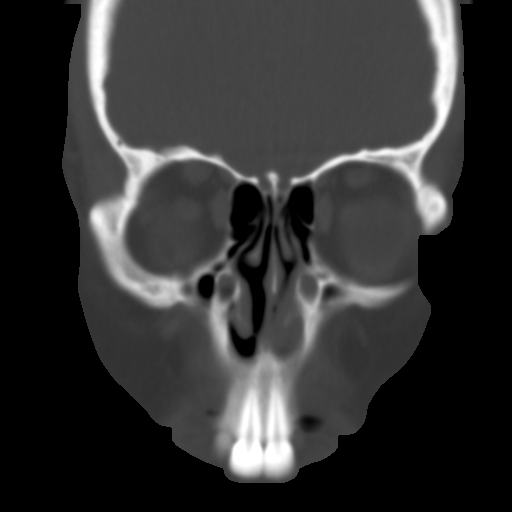
[im 11/14  bone]
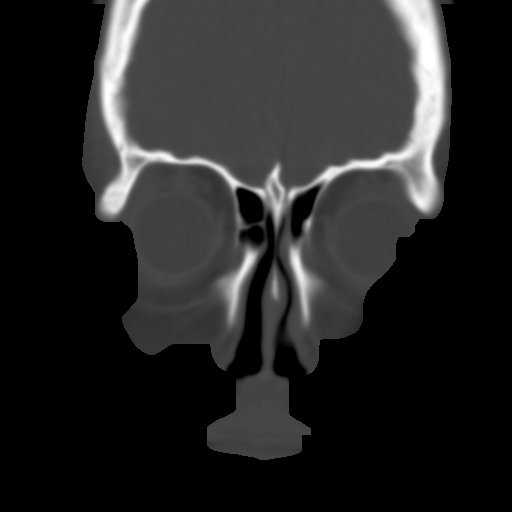
[im 12/14  bone]
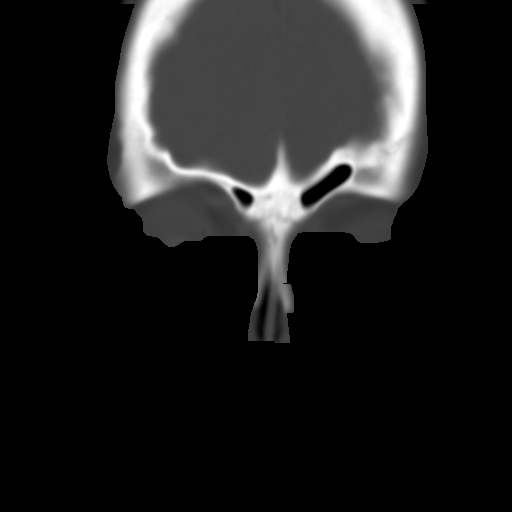
[im 13/14  bone]
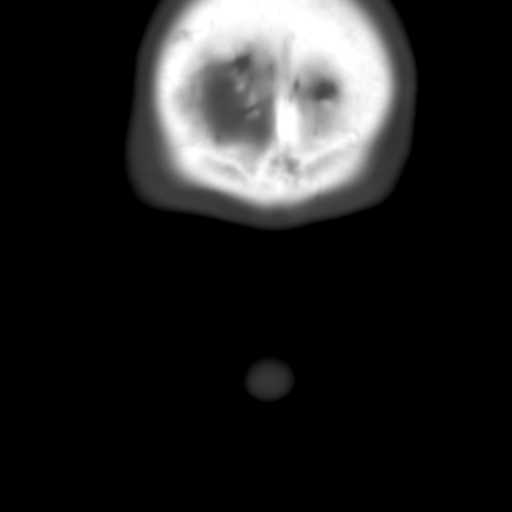

[12 of 14 positions shown; findings below may reference images not displayed]

FINDINGS: The paranasal sinuses are clear. No air-fluid levels, mucoperiosteal
thickening or polyp. The left middle and inferior turbinates are
slightly enlarged.

The visualized portion of the brain appears normal.
IMPRESSION: Clear paranasal sinuses.

## 2017-08-04 ENCOUNTER — Other Ambulatory Visit: Payer: Self-pay

## 2022-09-09 ENCOUNTER — Emergency Department
Admission: EM | Admit: 2022-09-09 | Discharge: 2022-09-10 | Disposition: A | Discharge status: Home / Self Care | Payer: Medicaid Other | Attending: Emergency Medicine | Admitting: Emergency Medicine

## 2022-09-09 DIAGNOSIS — M79601 Pain in right arm: Secondary | ICD-10-CM | POA: Insufficient documentation

## 2022-09-09 DIAGNOSIS — R609 Edema, unspecified: Secondary | ICD-10-CM | POA: Insufficient documentation

## 2022-09-09 MED ORDER — OXYCODONE-ACETAMINOPHEN 5-325 MG PO TABS
1.0000 | ORAL_TABLET | Freq: Once | ORAL | Status: AC
Start: 2022-09-09 — End: 2022-09-09
  Administered 2022-09-09: 1 via ORAL
  Filled 2022-09-09: qty 1

## 2022-09-09 NOTE — ED Triage Notes (Signed)
Janice Navarro is a 37 y.o. female presenting with R are swelling and pain with no known injury. No hx of DVT, smoke or birth control. No meds PTA

## 2022-09-09 NOTE — ED Provider Notes (Signed)
EMERGENCY DEPARTMENT HISTORY AND PHYSICAL EXAM     None      Date: 09/09/2022   Patient Name: Janice Navarro  Attending Physician: Marlis Edelson, DO   Advanced Practice Provider: Lendon Ka, FNP    History of Presenting Illness   Chief Complaint:  Chief Complaint   Patient presents with    Arm Pain     HPI: Kienna Moncada is a pleasant 37 y.o. female who  has no past medical history on file. (per Epic records), presents today with pain in RUE since this afternoon. Patient reports she was at work when she began having dull ache pain in RUE with swelling noted by coworkers. Patient denies any trauma to arm, previous injury to arm, or change in sensation/strength. Patient denies fevers, history of blood clots or DVT. No medications PTA. Denies recent surgery, travel, being on birth control, or being a smoker. Denies chest pain or shortness of breath. Patient reports pain worsens with movement of arm and improves with rest.    PCP: Pcp, None, MD  Past History   Past Medical History:  No past medical history on file.  Past Surgical History:  No past surgical history on file.  Family/Social History:  She has no history on file for tobacco use, alcohol use, and drug use.    No family history on file.    Allergies:  No Known Allergies    Listed Medications on Arrival:  Home Medications    None on File           Physical exam   Pulse 75  BP 125/83  Resp 20  SpO2 100 %  Temp 98.6 F (37 C)  Vitals:    09/09/22 2324 09/09/22 2326 09/10/22 0313   BP:  125/83 118/82   Pulse:  75 74   Resp:  20 18   Temp:  98.6 F (37 C)    TempSrc:  Oral    SpO2:  100% 99%   Weight: 90.7 kg         Physical Exam  I have reviewed the triage vital signs.  Const: Well nourished, well developed, appears stated age.  Eyes: PERRL, no conjunctival injection. No discharge.  HENT: NCAT, Neck supple without meningismus. B TM WNL. Nares patent. Throat pink  without swelling or exudate. No lymphadenopathy. Negative transillumination. No TTP  over  sinuses.  CV: RRR, Warm, well-perfused extremities with brisk cap refill and strong peripheral pulses.  RESP: Unlabored respiratory effort. CTAB.  GI: Soft, non-distended. Active bowel sounds. No pain, masses, or organomegaly to palpation.  MSK: No gross deformities appreciated.  Shoulder: No swelling, misalignment, or muscle wasting noted. PROM full and equal bilaterally.  No signs of instability. No TTP.  Elbow: PROM limited secondary to pain. Swelling, warmth, and TTP noted to tricep down to forearm. Sensation equal bilaterally.  Wrist: AROM of wrist full and equal bilaterally. Negative snuff box. NO TTP. Negative  Phalen/Finkelstein. U/M/R nerve sensation and strength grossly intact.   Skin: Warm, dry. No rashes. Warmth and swelling noted to lateral right arm from tricep to forearm. Swelling noted to right hand.  Neuro: Alert, CNs II-XII grossly intact. Sensation and motor function of extremities grossly  intact.  Psych: Appropriate mood and affect.      Diagnostic Study Results   Labs -     Results       Procedure Component Value Units Date/Time    Beta HCG Quantitative, Pregnancy [[id number]] Collected: 09/09/22 2354  Specimen: Blood, Venous Updated: 09/10/22 0039     hCG, Quantitative <2.4 mIU/mL     Narrative:      PREGNANCY TEST INTERPRETATION  Negative for pregnancy         <5.0 mIU/mL  Indeterminate for pregnancy   5.0 - 24.9 mIU/mL - Retest after                                48 hours (recommended) or retest                                 by alternate methodology.  Positive for pregnancy        >=25.0 mIU/mL      APPROXIMATE GESTATIONAL AGE   APPROXIMATE HCG mIU/mL  0-1 WEEKS                          <50      mIU/mL  1-2 WEEKS                        40-300     mIU/mL  2-3 WEEKS                       [phone]    mIU/mL  3-4 WEEKS                       [phone]    mIU/mL  4-8 WEEKS                     5,000-200,000 mIU/mL   8-12 WEEKS                   10,000-100,000 mIU/mL  2ND TRIMESTER                  3,000-50,000  mIU/mL  3RD TRIMESTER                 1,000-50,000  mIU/mL    This total Beta-hCG assay is approved for the testing  of pregnancy ONLY. It is not approved for any other  uses such as tumor marker screening or tumor marker  monitoring.    Rarely, some healthy, non-pregnant women may have higher  levels of BHCG.    Post menopausal women may elicit weakly positive results due  to low BHCG levels unrelated to pregnancy.    Some rare types of tumors may produce low levels BHCG.    Ectopic pregnancy and early natural termination cannot  be distinguished from early normal pregnancy by BHCG level  alone.    Comprehensive Metabolic Panel [[id number]]  (Abnormal) Collected: 09/09/22 2354    Specimen: Blood, Venous Updated: 09/10/22 0038     Glucose 85 mg/dL      BUN 7 mg/dL      Creatinine 0.8 mg/dL      Sodium 409 mEq/L      Potassium 4.2 mEq/L      Chloride 108 mEq/L      CO2 27 mEq/L      Calcium 8.9 mg/dL      Anion Gap 6.0     GFR >60.0 mL/min/1.73 m2      AST (SGOT) 19 U/L      ALT 9 U/L      Alkaline  Phosphatase 81 U/L      Albumin 3.4 g/dL      Protein, Total 7.0 g/dL      Globulin 3.6 g/dL      Albumin/Globulin Ratio 0.9     Bilirubin, Total 0.2 mg/dL     CBC with Differential [[id number]]  (Abnormal) Collected: 09/09/22 2354    Specimen: Blood, Venous Updated: 09/10/22 0015    Narrative:      The following orders were created for panel order CBC with Differential.  Procedure                               Abnormality         Status                     ---------                               -----------         ------                     CBC with Differential[[id number]]        Abnormal            Final result                 Please view results for these tests on the individual orders.    CBC with Differential [[id number]]  (Abnormal) Collected: 09/09/22 2354    Specimen: Blood, Venous Updated: 09/10/22 0015     WBC 9.46 x10 3/uL      Hemoglobin 11.1 g/dL      Hematocrit 21.3 %      Platelet Count  260 x10 3/uL      MPV 9.6 fL      RBC 3.92 x10 6/uL      MCV 89.0 fL      MCH 28.3 pg      MCHC 31.8 g/dL      RDW 13 %      nRBC % 0.0 /100 WBC      Absolute nRBC 0.00 x10 3/uL      Preliminary Absolute Neutrophil Count 5.97 x10 3/uL      Neutrophils % 63.2 %      Lymphocytes % 25.7 %      Monocytes % 7.9 %      Eosinophils % 2.6 %      Basophils % 0.4 %      Immature Granulocytes % 0.2 %      Absolute Neutrophils 5.97 x10 3/uL      Absolute Lymphocytes 2.43 x10 3/uL      Absolute Monocytes 0.75 x10 3/uL      Absolute Eosinophils 0.25 x10 3/uL      Absolute Basophils 0.04 x10 3/uL      Absolute Immature Granulocytes 0.02 x10 3/uL             Radiologic Studies -   Radiology Results (24 Hour)       Procedure Component Value Units Date/Time    [place] VenoDopp Upper Extremity Right [[id number]] Collected: 09/10/22 0140    Order Status: Completed Updated: 09/10/22 0143    Narrative:      HISTORY: Edema and pain    COMPARISON: None available.    TECHNIQUE: Wallace Cullens scale, color flow, and spectral Doppler waveform analysis  was performed  on the RIGHT upper extremity veins described below. There is  normal compressibility (where possible), phasic flow, and response to  augmentation unless otherwise noted.    FINDINGS:    RIGHT UPPER EXTREMITY:    Internal jugular vein: Normal  Brachiocephalic vein: Normal  Subclavian vein: Normal  Axillary vein: Normal  Brachial veins: Normal    Basilic vein: Normal  Cephalic vein: Normal    The left subclavian vein (imaged per protocol) is grossly patent.      Impression:         No deep venous thrombosis of the RIGHT upper extremity.    Johnsie Kindred, MD  09/10/2022 1:41 AM    Elbow Right AP Lateral and Obliques [[id number]] Collected: 09/10/22 0058    Order Status: Completed Updated: 09/10/22 0101    Narrative:      HISTORY:  Right elbow pain    COMPARISON:  None    FINDINGS:  Four views of the right elbow demonstrate no evidence of  fracture, misalignment, or joint effusion. No significant soft  tissue  swelling is observed.      Impression:        Normal right elbow.    Gerlene Burdock, MD  09/10/2022 12:59 AM        .      Procedures     Procedures:   Procedures    Medical Decision Making & ED Course     I reviewed the vital signs, available nursing notes, past medical history, past surgical history, family history, social history, and this visit's labs and radiology listed above.    Medications given in the ED:  ED Medication Orders (From admission, onward)      Start Ordered     Status Ordering Provider    09/10/22 0900 09/10/22 0210    Daily        Route: Oral  Ordered Dose: 1.2 mg       Discontinued Marlis Edelson    09/10/22 2841 09/10/22 3244  colchicine tablet 1.2 mg  Daily        Route: Oral  Ordered Dose: 1.2 mg       Last MAR action: Given Marlis Edelson    09/10/22 0211 09/10/22 0210  predniSONE (DELTASONE) tablet 60 mg  Once        Route: Oral  Ordered Dose: 60 mg       Last MAR action: Given Marlis Edelson    09/09/22 2352 09/09/22 2351  oxyCODONE-acetaminophen (PERCOCET) 5-325 MG per tablet 1 tablet  Once        Route: Oral  Ordered Dose: 1 tablet       Last MAR action: Given Franz Svec D            Medications Prescribed: (Note: any 600mg  or 800mg  ibuprofen tablets are prescription dosing, not OTC)  Discharge Prescriptions       Medication Sig Dispense Auth. Provider    colchicine 0.6 MG tablet Take 1 tablet (0.6 mg) by mouth daily for 5 days 5 tablet Andrena Mews D, FNP    predniSONE (DELTASONE) 20 MG tablet Take 2 tablets (40 mg) by mouth daily for 5 days 10 tablet Andrena Mews D, FNP    ibuprofen (ADVIL) 600 MG tablet Take 1 tablet (600 mg) by mouth every 6 (six) hours as needed for Pain or Fever 20 tablet Lendon Ka, FNP            Clinical Decision Support:  Vital Signs-Reviewed the patient's vital signs.   Patient Vitals for the past 12 hrs:   BP Temp Pulse Resp   09/10/22 0313 118/82 -- 74 18   09/09/22 2326 125/83 98.6 F (37 C) 75 20       ED Course:         Provider Notes:    37 y.o. female with history as listed above presents today with concern for right arm pain and swelling since this afternoon. No constitutional symptoms and CBC with no acute findings to give low suspicion for infectious source of symptoms.  No signs of neurovascular compromise on clinical exam.  Ultrasound with no signs of DVT.  I independently reviewed the x-ray and agree with radiology interpretation with no acute findings.  Patient without any signs of IgE mediation to symptoms today.  Patient without any reported trauma.  Given no acute findings in workup but significant findings on clinical exam, suspicion for gout/pseudogout as source to pain today.  Patient without significant history but improvement in symptoms with colchicine and anti-inflammatories while in department.  Patient to continue with steroid, NSAID, and colchicine and follow-up with primary care provider.  Strict return precautions given to patient with verbalized understanding.    Though other pathology possible, pt is without current signs of instability, medical urgency or emergency.  Pt well appearing at discharge.  Pt/caregiver understands possibility of progression of disease, need for continued care and assessment outpatient.  Aftercare and return precautions discussed.    Diagnosis & Disposition     Clinical Impression:   1. Right arm pain        Treatment Plan:   ED Disposition       ED Disposition   Discharge    Condition   --    Date/Time   Thu Sep 10, 2022  3:03 AM    Comment   Janice Navarro discharge to home/self care.    Condition at disposition: Stable                 Followup: (See discharge instructions if not listed here)     This note was completed using dragon medical speech recognition software. Grammatical errors, random word insertions, pronoun errors, incorrect word insertion, misspellings and incomplete sentences are occasional consequences of this technology due to software limitations. If there are  questions or concerns about the content of this note or information contained within the body of this dictation they should be addressed with the provider for clarification. This chart may have been refreshed after admission or discharge, and therefore some results may not have been available for my review at the time of my involvement with the patient.  _____________________________    CHART OWNERSHIP: I, Lendon Ka, FNP, am the primary clinician of record.       Andrena Mews D, FNP  09/10/22 0326       Cassell Clement V, DO  10/07/22 1026

## 2022-09-10 ENCOUNTER — Emergency Department: Payer: Medicaid Other

## 2022-09-10 LAB — COMPREHENSIVE METABOLIC PANEL
ALT: 9 U/L (ref 0–55)
AST (SGOT): 19 U/L (ref 5–41)
Albumin/Globulin Ratio: 0.9 (ref 0.9–2.2)
Albumin: 3.4 g/dL — ABNORMAL LOW (ref 3.5–5.0)
Alkaline Phosphatase: 81 U/L (ref 37–117)
Anion Gap: 6 (ref 5.0–15.0)
BUN: 7 mg/dL (ref 7–21)
Bilirubin, Total: 0.2 mg/dL (ref 0.2–1.2)
CO2: 27 mEq/L (ref 17–29)
Calcium: 8.9 mg/dL (ref 8.5–10.5)
Chloride: 108 mEq/L (ref 99–111)
Creatinine: 0.8 mg/dL (ref 0.4–1.0)
GFR: >60 mL/min/{1.73_m2} (ref >=60.0)
Globulin: 3.6 g/dL (ref 2.0–3.6)
Glucose: 85 mg/dL (ref 70–100)
Potassium: 4.2 mEq/L (ref 3.5–5.3)
Protein, Total: 7 g/dL (ref 6.0–8.3)
Sodium: 141 mEq/L (ref 135–145)

## 2022-09-10 LAB — LAB USE ONLY - CBC WITH DIFFERENTIAL
Absolute Basophils: 0.04 10*3/uL (ref 0.00–0.08)
Absolute Eosinophils: 0.25 10*3/uL (ref 0.00–0.44)
Absolute Immature Granulocytes: 0.02 10*3/uL (ref 0.00–0.07)
Absolute Lymphocytes: 2.43 10*3/uL (ref 0.42–3.22)
Absolute Monocytes: 0.75 10*3/uL (ref 0.21–0.85)
Absolute Neutrophils: 5.97 10*3/uL (ref 1.10–6.33)
Absolute nRBC: 0 10*3/uL (ref <=0.00)
Basophils %: 0.4 %
Eosinophils %: 2.6 %
Hematocrit: 34.9 % (ref 34.7–43.7)
Hemoglobin: 11.1 g/dL — ABNORMAL LOW (ref 11.4–14.8)
Immature Granulocytes %: 0.2 %
Lymphocytes %: 25.7 %
MCH: 28.3 pg (ref 25.1–33.5)
MCHC: 31.8 g/dL (ref 31.5–35.8)
MCV: 89 fL (ref 78.0–96.0)
MPV: 9.6 fL (ref 8.9–12.5)
Monocytes %: 7.9 %
Neutrophils %: 63.2 %
Platelet Count: 260 10*3/uL (ref 142–346)
Preliminary Absolute Neutrophil Count: 5.97 10*3/uL (ref 1.10–6.33)
RBC: 3.92 10*6/uL (ref 3.90–5.10)
RDW: 13 % (ref 11–15)
WBC: 9.46 10*3/uL (ref 3.10–9.50)
nRBC %: 0 /100 WBC (ref <=0.0)

## 2022-09-10 LAB — BETA HCG QUANTITATIVE, PREGNANCY: hCG, Quantitative: <2.4 m[IU]/mL

## 2022-09-10 MED ORDER — IBUPROFEN 600 MG PO TABS
600.0000 mg | ORAL_TABLET | Freq: Four times a day (QID) | ORAL | 0 refills | Status: DC | PRN
Start: 2022-09-10 — End: 2023-04-30

## 2022-09-10 MED ORDER — PREDNISONE 20 MG PO TABS
60.0000 mg | ORAL_TABLET | Freq: Once | ORAL | Status: AC
Start: 2022-09-10 — End: 2022-09-10
  Administered 2022-09-10: 60 mg via ORAL
  Filled 2022-09-10: qty 3

## 2022-09-10 MED ORDER — PREDNISONE 20 MG PO TABS
40.0000 mg | ORAL_TABLET | Freq: Every day | ORAL | 0 refills | Status: AC
Start: 2022-09-10 — End: 2022-09-15

## 2022-09-10 MED ORDER — COLCHICINE 0.6 MG PO TABS
0.6000 mg | ORAL_TABLET | Freq: Every day | ORAL | 0 refills | Status: AC
Start: 2022-09-10 — End: 2022-09-15

## 2022-09-10 MED ORDER — COLCHICINE 0.6 MG PO TABS
1.2000 mg | ORAL_TABLET | Freq: Every day | ORAL | Status: DC
Start: 2022-09-10 — End: 2022-09-10

## 2022-09-10 MED ORDER — COLCHICINE 0.6 MG PO TABS
1.2000 mg | ORAL_TABLET | Freq: Every day | ORAL | Status: DC
Start: 2022-09-10 — End: 2022-09-10
  Administered 2022-09-10: 1.2 mg via ORAL
  Filled 2022-09-10: qty 2

## 2022-10-07 NOTE — ED Provider Notes (Signed)
EMERGENCY DEPARTMENT   HISTORY AND PHYSICAL EXAM       Patient: Janice Navarro   MRN:  [id number]       ED Attending Note  The patient was seen and examined by the PA/NP and the plan of care was discussed with me.  I have reviewed the history, physical exam, clinical impression, plan and agree.  The patient also had a face-to-face evaluation by me.          History of Present Illness      Chief Complaint:   Chief Complaint   Patient presents with    Arm Pain       Janice Navarro is a 37 y.o. female who denies pertinent pmhx presenting to the ED with gradual onset mild to moderate intensity atraumatic R elbow pain with radiation and swelling along R forearm. Began while at work. No rash. No fevers, weakness or paresthesias. Due to persistence of sxs, came to ED for evaluation. Denies shoulder pain. No hx of DVT or PE.       Medical Decision Making, Diagnosis and Disposition   CHART OWNERSHIP: This note is prepared by Cassell Clement, DO. I am the first provider for this patient.    Complexity of Data Reviewed:  Vital Signs: Reviewed the patient's vital signs during ED stay.  Visit Vitals  BP 118/82   Pulse 74   Temp 98.6 F (37 C) (Oral)   Resp 18   Wt 90.7 kg   LMP 09/07/2022   SpO2 99%       I reviewed pt's pulse oxymetry and cardiac monitor values, as relevant to the case.    Pulse Oximetry Analysis:  99% on RA - Normal    Cardiac Monitor:  Rhythm:  Normal Sinus, Rate:  Normal, Ectopy:  None    Personal Protective Equipment (PPE)  Gloves and surgical mask.    Record Review: I have conducted an independent chart review as pertinent to today's visit. The following, if applicable to the case, were reviewed and noted:    Past medical, social, surgical, and family history.  Old medical records.  Nursing notes.  Outside records.       Labs and Radiology:  All labs and imaging studies, if ordered, were reviewed and interpreted independently by me and confirmed by radiology report, see ED course. Formal radiology impressions  included under "diagnostic studies" at the end of chart.       ED Course as of 10/07/22 1031   Wed Oct 07, 2022   1031 WBC: 9.46 [TS]   1031 Hemoglobin(!): 11.1 [TS]   1031 Hematocrit: 34.9 [TS]   1031 Platelet Count: 260 [TS]   1031 Creatinine: 0.8 [TS]   1031 hCG, Quant.: <2.4 [TS]   1031 Elbow Right AP Lateral and Obliques  FINDINGS:  Four views of the right elbow demonstrate no evidence of  fracture, misalignment, or joint effusion. No significant soft tissue  swelling is observed.   [TS]   1031 [place] VenoDopp Upper Extremity Right  No deep venous thrombosis of the RIGHT upper extremity. [TS]      ED Course User Index  [TS] Marlis Edelson, DO           CLINICAL DECISION-MAKING:  37 y.o. female presents with above.     R elbow pain, atraumatic. Boggy ttp along elbow with some nonpitting edema R forearm. Exam is not c/w arterial ischemia, necrotizing fasciitis (no soft tissue gas on XR), compartment syndrome. No noted rash. FROM  at joint without significant effusion doubt septic arthritis. DVT study negative. Considered crystal arthropathy, thoracic outlet syndrome, radicular pain. Will treat with colchicine, advil, prednisone course. PMD f/u, return precautions addressed.       Risk:    Consideration for Escalation of Care:  Escalation of care, including admission/observation considered due to presenting symptoms and history, but patient was able to be discharged due to nontoxic appearance, stable hemodynamics, reassuring workup and re-evaluation following intervention. Joint decision-making employed.         Problems Addressed:  Diagnoses:  1. Right arm pain            Please see CLINICAL DECISION-MAKING   Follow up: PMD  Prescribed colchicine, advil, prednisone      Disposition  ED Disposition       ED Disposition   Discharge    Condition   --    Date/Time   Thu Sep 10, 2022  3:03 AM    Comment   [name] discharge to home/self care.    Condition at disposition: Stable                 The above-stated  information was discussed with patient and available family members at bedside.  All questions and concerns were addressed to the best of my ability prior to disposition.    The patient and/or family is/are aware that today's emergency department evaluation has limitations and is only a screening that can be falsely reassuring.  We discussed the need for follow up and strict return precautions. Patient and/or family demonstrate verbal understanding that they can return to the emergency department at any given time if they are having worsening symptoms, other complaints or difficulty with followup.     Physical Exam      Physical Exam  Constitutional: WDWN NAD  HENT: conjunctiva normal, moist mucous membranes  Neck: supple  Respiratory: CTAB AP, even unlabored respirations   Cardiovascular: RRR, capillary refill <2 second  RUE: ttp over elbow with edema distally along forearm. FROM R shoulder abduction, R elbow flexion/extension, R wrist flexion/extension. Compartments in forearm and upper arm are soft. Distal radial pulses are 2+. No pitting edema. Sensation C5-T1 dermatomes intact to dull touch.   Neuro: A+Ox3, answers questions appropriately   Skin: Warm and dry, no rashes  Psych: Normal mood and affect     Past History     Past medical history:  Medical History[1]  Past surgical history:  Denies pertinent  Family history:  Denies pertinent  Social determinants of health:   Social Determinants of Health     Tobacco Use: Low Risk  (10/15/2021)    Received from [facility])    Patient History     Smoking Tobacco Use: Never     Smokeless Tobacco Use: Never     Passive Exposure: Never   Alcohol Use: Not on file (09/01/2021)   Financial Resource Strain: Not on file   Food Insecurity: No Food Insecurity (09/09/2022)    Hunger Vital Sign     Worried About Running Out of Food in the Last Year: Never true     Ran Out of Food in the Last Year: Never true   Transportation Needs: No Transportation  Needs (09/09/2022)    PRAPARE - Therapist, art (Medical): No     Lack of Transportation (Non-Medical): No   Physical Activity: Not on file   Stress: Not on file   Social  Connections: Unknown (07/23/2022)    Received from [facility] Short Social Needs Screening - Social Connection     Would you like help with any of the following needs: food, medicine/medical supplies, transportation, loneliness, housing or utilities?: Not on file   Intimate Partner Violence: Not At Risk (09/09/2022)    Humiliation, Afraid, Rape, and Kick questionnaire     Fear of Current or Ex-Partner: No     Emotionally Abused: No     Physically Abused: No     Sexually Abused: No   Depression: Not on file   Housing Stability: Unknown (09/09/2022)    Housing Stability Vital Sign     Unable to Pay for Housing in the Last Year: No     Number of Places Lived in the Last Year: Not on file     Unstable Housing in the Last Year: No   Utilities: Not At Risk (09/09/2022)    [employer] Utilities     Threatened with loss of utilities: No   Health Literacy: Not on file     Allergies: Reviewed and up-to-date in pt's chart    Allergies and Medications     Allergies[2]    Home Medications    None on File         Review of Systems     See HPI for pertinent positive and negative ROS.    Diagnostic Studies     Labs:  Labs Reviewed   COMPREHENSIVE METABOLIC PANEL - Abnormal; Notable for the following components:       Result Value    Albumin 3.4 (*)     All other components within normal limits   LAB USE ONLY - CBC WITH DIFFERENTIAL - Abnormal; Notable for the following components:    Hemoglobin 11.1 (*)     All other components within normal limits   CBC AND DIFFERENTIAL    Narrative:     The following orders were created for panel order CBC with Differential.  Procedure                               Abnormality         Status                     ---------                               -----------         ------                     CBC with  Differential[[id number]]        Abnormal            Final result                 Please view results for these tests on the individual orders.   BETA HCG QUANTITATIVE, PREGNANCY    Narrative:     PREGNANCY TEST INTERPRETATION  Negative for pregnancy         <5.0 mIU/mL  Indeterminate for pregnancy   5.0 - 24.9 mIU/mL - Retest after                                48 hours (recommended) or retest  by alternate methodology.  Positive for pregnancy        >=25.0 mIU/mL      APPROXIMATE GESTATIONAL AGE   APPROXIMATE HCG mIU/mL  0-1 WEEKS                          <50      mIU/mL  1-2 WEEKS                        40-300     mIU/mL  2-3 WEEKS                       [phone]    mIU/mL  3-4 WEEKS                       [phone]    mIU/mL  4-8 WEEKS                     5,000-200,000 mIU/mL   8-12 WEEKS                   10,000-100,000 mIU/mL  2ND TRIMESTER                 3,000-50,000  mIU/mL  3RD TRIMESTER                 1,000-50,000  mIU/mL    This total Beta-hCG assay is approved for the testing  of pregnancy ONLY. It is not approved for any other  uses such as tumor marker screening or tumor marker  monitoring.    Rarely, some healthy, non-pregnant women may have higher  levels of BHCG.    Post menopausal women may elicit weakly positive results due  to low BHCG levels unrelated to pregnancy.    Some rare types of tumors may produce low levels BHCG.    Ectopic pregnancy and early natural termination cannot  be distinguished from early normal pregnancy by BHCG level  alone.       Radiological Studies:  [place] VenoDopp Upper Extremity Right    Result Date: 09/10/2022  HISTORY: Edema and pain COMPARISON: None available. TECHNIQUE: Wallace Cullens scale, color flow, and spectral Doppler waveform analysis was performed on the RIGHT upper extremity veins described below. There is normal compressibility (where possible), phasic flow, and response to augmentation unless otherwise noted. FINDINGS: RIGHT UPPER EXTREMITY:  Internal jugular vein: Normal Brachiocephalic vein: Normal Subclavian vein: Normal Axillary vein: Normal Brachial veins: Normal Basilic vein: Normal Cephalic vein: Normal The left subclavian vein (imaged per protocol) is grossly patent.      No deep venous thrombosis of the RIGHT upper extremity. Johnsie Kindred, MD 09/10/2022 1:41 AM    Elbow Right AP Lateral and Obliques    Result Date: 09/10/2022  HISTORY:  Right elbow pain COMPARISON:  None FINDINGS:  Four views of the right elbow demonstrate no evidence of fracture, misalignment, or joint effusion. No significant soft tissue swelling is observed.       Normal right elbow. Gerlene Burdock, MD 09/10/2022 12:59 AM       ------------------------------------------------------------------------------------------------------------------------------------------  This note was generated by the Epic EMR system/ Dragon speech recognition and may contain inherent errors or omissions not intended by the user. Grammatical errors, random word insertions, deletions and pronoun errors  are occasional consequences of this technology due to software limitations. Not all errors are caught or corrected. If there are questions  or concerns about the content of this note or information contained within the body of this dictation they should be addressed directly with the author for clarification           [1] No past medical history on file.  [2] No Known Allergies       Marlis Edelson, DO  10/07/22 1034

## 2023-01-24 ENCOUNTER — Emergency Department: Payer: Medicaid Other

## 2023-01-24 ENCOUNTER — Emergency Department
Admission: EM | Admit: 2023-01-24 | Discharge: 2023-01-24 | Disposition: A | Discharge status: Home / Self Care | Payer: Medicaid Other

## 2023-01-24 DIAGNOSIS — R058 Other specified cough: Secondary | ICD-10-CM | POA: Insufficient documentation

## 2023-01-24 HISTORY — DX: Unspecified asthma, uncomplicated: J45.909

## 2023-01-24 MED ORDER — BENZONATATE 100 MG PO CAPS
100.0000 mg | ORAL_CAPSULE | Freq: Three times a day (TID) | ORAL | 0 refills | Status: AC | PRN
Start: 2023-01-24 — End: ?

## 2023-01-24 MED ORDER — ALBUTEROL SULFATE (2.5 MG/3ML) 0.083% IN NEBU
5.0000 mg | INHALATION_SOLUTION | Freq: Once | RESPIRATORY_TRACT | Status: AC
Start: 2023-01-24 — End: 2023-01-24
  Administered 2023-01-24: 5 mg via RESPIRATORY_TRACT
  Filled 2023-01-24: qty 6

## 2023-01-24 MED ORDER — ALBUTEROL-IPRATROPIUM 2.5-0.5 (3) MG/3ML IN SOLN
3.0000 mL | Freq: Once | RESPIRATORY_TRACT | Status: AC
Start: 2023-01-24 — End: 2023-01-24
  Administered 2023-01-24: 3 mL via RESPIRATORY_TRACT
  Filled 2023-01-24: qty 3

## 2023-01-24 NOTE — ED Triage Notes (Signed)
Pt is a 37 y.o female brought into the Ed w/ c/o of cough and sob. Pt reports a consistent cough that has been going on for three weeks now and lightheaded and sob that started 3 days ago. Hx of asthma

## 2023-01-24 NOTE — ED Provider Notes (Signed)
 EMERGENCY DEPARTMENT HISTORY AND PHYSICAL EXAM      Patient Name: Janice Navarro  Age: 37 y.o. female  Encounter Date:  01/24/2023  Department:[facility]  Patient Room: RE2/RE2  PCP: Barbra Sarks, MD     History of Presenting Illness     Chie

## 2023-01-24 NOTE — Discharge Instructions (Signed)
 Will prescribe lidocaine patch, albuterol inhaler A cough is a common symptom of viral infections. It usually goes away after you've recovered. A post-viral cough is when your cough sticks around long after you've healed.  Coughing is an important part of

## 2023-01-25 LAB — ECG 12-LEAD
Atrial Rate: 83 {beats}/min
IHS MUSE NARRATIVE AND IMPRESSION: NORMAL
P Axis: 68 degrees
P-R Interval: 162 ms
Q-T Interval: 384 ms
QRS Duration: 88 ms
QTC Calculation (Bezet): 451 ms
R Axis: 59 degrees
T Axis: 25 degrees
Ventricular Rate: 83 {beats}/min

## 2023-04-30 ENCOUNTER — Emergency Department
Admission: EM | Admit: 2023-04-30 | Discharge: 2023-04-30 | Disposition: A | Discharge status: Home / Self Care | Payer: Medicaid Other | Attending: Emergency Medicine | Admitting: Emergency Medicine

## 2023-04-30 DIAGNOSIS — M79651 Pain in right thigh: Secondary | ICD-10-CM | POA: Insufficient documentation

## 2023-04-30 DIAGNOSIS — M79652 Pain in left thigh: Secondary | ICD-10-CM | POA: Insufficient documentation

## 2023-04-30 DIAGNOSIS — M79604 Pain in right leg: Secondary | ICD-10-CM

## 2023-04-30 LAB — COMPREHENSIVE METABOLIC PANEL
ALT: 12 U/L (ref <=55)
AST (SGOT): 22 U/L (ref <=41)
Albumin/Globulin Ratio: 0.9 (ref 0.9–2.2)
Albumin: 3.6 g/dL (ref 3.5–5.0)
Alkaline Phosphatase: 87 U/L (ref 37–117)
Anion Gap: 8 (ref 5.0–15.0)
BUN: 8 mg/dL (ref 7–21)
Bilirubin, Total: 0.3 mg/dL (ref 0.2–1.2)
CO2: 24 meq/L (ref 17–29)
Calcium: 9.2 mg/dL (ref 8.5–10.5)
Chloride: 107 meq/L (ref 99–111)
Creatinine: 0.6 mg/dL (ref 0.4–1.0)
GFR: >60 mL/min/{1.73_m2} (ref >=60.0)
Globulin: 4 g/dL — ABNORMAL HIGH (ref 2.0–3.6)
Glucose: 113 mg/dL — ABNORMAL HIGH (ref 70–100)
Potassium: 3.6 meq/L (ref 3.5–5.3)
Protein, Total: 7.6 g/dL (ref 6.0–8.3)
Sodium: 139 meq/L (ref 135–145)

## 2023-04-30 LAB — LAB USE ONLY - CBC WITH DIFFERENTIAL
Absolute Basophils: 0.02 10*3/uL (ref 0.00–0.08)
Absolute Eosinophils: 0.01 10*3/uL (ref 0.00–0.44)
Absolute Immature Granulocytes: 0.04 10*3/uL (ref 0.00–0.07)
Absolute Lymphocytes: 1.83 10*3/uL (ref 0.42–3.22)
Absolute Monocytes: 0.74 10*3/uL (ref 0.21–0.85)
Absolute Neutrophils: 8.62 10*3/uL — ABNORMAL HIGH (ref 1.10–6.33)
Absolute nRBC: 0 10*3/uL (ref <=0.00)
Basophils %: 0.2 %
Eosinophils %: 0.1 %
Hematocrit: 38.3 % (ref 34.7–43.7)
Hemoglobin: 12.4 g/dL (ref 11.4–14.8)
Immature Granulocytes %: 0.4 %
Lymphocytes %: 16.3 %
MCH: 28.8 pg (ref 25.1–33.5)
MCHC: 32.4 g/dL (ref 31.5–35.8)
MCV: 88.9 fL (ref 78.0–96.0)
MPV: 9.7 fL (ref 8.9–12.5)
Monocytes %: 6.6 %
Neutrophils %: 76.4 %
Platelet Count: 321 10*3/uL (ref 142–346)
Preliminary Absolute Neutrophil Count: 8.62 10*3/uL — ABNORMAL HIGH (ref 1.10–6.33)
RBC: 4.31 10*6/uL (ref 3.90–5.10)
RDW: 13 % (ref 11–15)
WBC: 11.26 10*3/uL — ABNORMAL HIGH (ref 3.10–9.50)
nRBC %: 0 /100{WBCs} (ref <=0.0)

## 2023-04-30 LAB — SEDIMENTATION RATE: Sed Rate: 81 mm/h — ABNORMAL HIGH (ref <=20)

## 2023-04-30 LAB — C-REACTIVE PROTEIN HIGH SENSITIVE: High Sensitivity C-Reactive Protein: 1.21 mg/dL — ABNORMAL HIGH (ref 0.00–1.00)

## 2023-04-30 LAB — BETA HCG QUANTITATIVE, PREGNANCY: hCG, Quantitative: <2.4 m[IU]/mL

## 2023-04-30 LAB — CREATINE KINASE (CK): Creatine Kinase (CK): 81 U/L (ref 29–233)

## 2023-04-30 MED ORDER — NAPROXEN 500 MG PO TABS
500.0000 mg | ORAL_TABLET | Freq: Two times a day (BID) | ORAL | 0 refills | Status: AC
Start: 2023-04-30 — End: ?

## 2023-04-30 MED ORDER — KETOROLAC TROMETHAMINE 30 MG/ML IJ SOLN
30.0000 mg | Freq: Once | INTRAMUSCULAR | Status: AC
Start: 2023-04-30 — End: 2023-04-30
  Administered 2023-04-30: 30 mg via INTRAVENOUS
  Filled 2023-04-30: qty 1

## 2023-04-30 NOTE — ED Provider Notes (Signed)
 EMERGENCY DEPARTMENT HISTORY AND PHYSICAL EXAM      History of Presenting Illness:  History Provided By: Patient and mother    Janice Navarro is a 38 y.o. female pw bilateral thigh pain.  Started 2 days ago.  Constant.  No abd pain or joint swelling.     Chronic back pain attributed to scoliosis, seems worse over the past few days.   RUE/elbow pain/swelling 2 yrs ago.  Poss gout, improved after approx 1 wk with meds.     No rash or fever.     Past Medical History:   Diagnosis Date    Asthma       Social Drivers of Health     Alcohol Use: Not on file (09/01/2021)   Financial Resource Strain: Not on file   Food Insecurity: No Food Insecurity (01/24/2023)    Hunger Vital Sign     Worried About Running Out of Food in the Last Year: Never true     Ran Out of Food in the Last Year: Never true   Transportation Needs: No Transportation Needs (01/24/2023)    PRAPARE - Therapist, Art (Medical): No     Lack of Transportation (Non-Medical): No   Physical Activity: Not on file   Stress: Not on file   Social Connections: Unknown (07/23/2022)    Received from [facility], [facility] Short Social Needs Screening - Social Connection     Would you like help with any of the following needs: food, medicine/medical supplies, transportation, loneliness, housing or utilities?: Not on file   Intimate Partner Violence: Not At Risk (01/24/2023)    Humiliation, Afraid, Rape, and Kick questionnaire     Fear of Current or Ex-Partner: No     Emotionally Abused: No     Physically Abused: No     Sexually Abused: No   Depression: Not on file   Housing Stability: Low Risk  (01/24/2023)    Housing Stability Vital Sign     Unable to Pay for Housing in the Last Year: No     Number of Times Moved in the Last Year: 0     Homeless in the Last Year: No   Utilities: Not At Risk (01/24/2023)    [facility] Utilities     Threatened with loss of utilities: No   Health Literacy: Not on file   Tobacco Use: Low Risk  (04/30/2023)     Patient History     Smoking Tobacco Use: Never     Smokeless Tobacco Use: Never     Passive Exposure: Not on file       Reviewed and confirmed past medical, surgical, family and social history as documented.    PCP: Enola Berl Carpenter, MD  SPECIALISTS:    Physical Exam:  Vitals:    04/30/23 0846 04/30/23 0848 04/30/23 1106   BP:  118/77 112/81   Pulse:  90 66   Resp:  16 18   Temp:  97.9 F (36.6 C) 98.8 F (37.1 C)   TempSrc:  Oral Oral   SpO2:  98% 96%   Weight: 93.8 kg     Height: 4' 11 (1.499 m)       Pulse Oximetry Interpretation: Normal     Physical Exam   Constitutional: Patient is alert.  Well nourished.  NAD  Head: Atraumatic.   Eyes: EOMI. PERRL  ENT:  MMM.   Neck:  FROM. No spinal tenderness. Neck supple.  Cardiovascular: Normal rate and regular rhythm.   Pulmonary/Chest: Effort normal and breath sounds normal. No respiratory distress.   Abdominal: Soft. There is no tenderness. Bowel sounds present and normal.    Musculoskeletal:  No large joint effusions.  No calf tenderness or swelling.   Neurological: Patient is alert and oriented to person, place, and time.  No focal deficits.   Skin: No rash.     Labs:   Abnormal Labs Reviewed   COMPREHENSIVE METABOLIC PANEL - Abnormal; Notable for the following components:       Result Value    Glucose 113 (*)     Globulin 4.0 (*)     All other components within normal limits   SEDIMENTATION RATE - Abnormal; Notable for the following components:    Sed Rate 81 (*)     All other components within normal limits   C-REACTIVE PROTEIN HIGH SENSITIVE - Abnormal; Notable for the following components:    High Sensitivity C-Reactive Protein 1.21 (*)     All other components within normal limits   LAB USE ONLY - CBC WITH DIFFERENTIAL - Abnormal; Notable for the following components:    WBC 11.26 (*)     Preliminary Absolute Neutrophil Count 8.62 (*)     Absolute Neutrophils 8.62 (*)     All other components within normal limits   All labs have been personally  reviewed.     Patient Update Notes:       Medication given in the ED:    ED Medication Orders (From admission, onward)      Start Ordered     Status Ordering Provider    04/30/23 [phone] 04/30/23 0927  ketorolac  (TORADOL ) injection 30 mg  Once        Route: Intravenous  Ordered Dose: 30 mg       Last MAR action: Given CURT REDELL HERO            Provider Notes: Bilateral thigh pain.  No trauma or injury.  No calf pain or tenderness.  Previous right arm pain/swelling, ?improved w/gout treatment.      Prescribed (and considered but not prescribed):   Discharge Medication List as of 04/30/2023 10:56 AM        START taking these medications    Details   naproxen  (NAPROSYN ) 500 MG tablet Take 1 tablet (500 mg) by mouth 2 (two) times daily with meals, Starting Fri 04/30/2023, [facility]             Clinical Impression:   1. Bilateral leg pain        ED Disposition       ED Disposition   Discharge    Condition   --    Date/Time   Fri Apr 30, 2023 10:55 AM    Comment   Janice Navarro discharge to home/self care.    Condition at disposition: Stable               CHART OWNERSHIP: Dr. Redell Curt is the primary emergency physician of record.    This note was generated by the Epic EMR system/ Dragon speech recognition and may contain inherent errors or omissions not intended by the user. Grammatical errors, random word insertions, deletions and pronoun errors  are occasional consequences of this technology due to software limitations. Not all errors are caught or corrected. If there are questions or concerns about the content of this note or information contained within the body of this dictation they should be  addressed directly with the author for clarification     Curt Redell HERO, MD  05/03/23 [phone]

## 2023-04-30 NOTE — ED Triage Notes (Signed)
 Janice Navarro is a 38 y.o. female c/o bilateral leg swelling starting two days ago. Pt denies trauma/injury. No recent travel, no hx of DVT, no contraceptive use. No pitting edema assessed. No recent change to exercise/lifestyle regimens. No cardiac  hx.
